# Patient Record
Sex: Male | Born: 1960 | Race: White | Hispanic: No | Smoking: Current every day smoker
Health system: Southern US, Community
[De-identification: ages and names within clinical notes are randomized; demographics above are authoritative.]

## PROBLEM LIST (undated history)

## (undated) DIAGNOSIS — R413 Other amnesia: Secondary | ICD-10-CM

## (undated) DIAGNOSIS — O2230 Deep phlebothrombosis in pregnancy, unspecified trimester: Secondary | ICD-10-CM

## (undated) DIAGNOSIS — F259 Schizoaffective disorder, unspecified: Secondary | ICD-10-CM

## (undated) DIAGNOSIS — F0280 Dementia in other diseases classified elsewhere without behavioral disturbance: Secondary | ICD-10-CM

## (undated) DIAGNOSIS — G309 Alzheimer's disease, unspecified: Secondary | ICD-10-CM

## (undated) DIAGNOSIS — K219 Gastro-esophageal reflux disease without esophagitis: Secondary | ICD-10-CM

## (undated) DIAGNOSIS — Z720 Tobacco use: Secondary | ICD-10-CM

## (undated) DIAGNOSIS — I739 Peripheral vascular disease, unspecified: Secondary | ICD-10-CM

## (undated) DIAGNOSIS — J449 Chronic obstructive pulmonary disease, unspecified: Secondary | ICD-10-CM

## (undated) DIAGNOSIS — E785 Hyperlipidemia, unspecified: Secondary | ICD-10-CM

## (undated) HISTORY — DX: Tobacco use: Z72.0

## (undated) HISTORY — PX: OTHER SURGICAL HISTORY: SHX169

---

## 2003-02-08 ENCOUNTER — Encounter: Payer: Self-pay | Admitting: *Deleted

## 2003-02-08 ENCOUNTER — Emergency Department (HOSPITAL_COMMUNITY): Admission: EM | Admit: 2003-02-08 | Discharge: 2003-02-08 | Payer: Self-pay | Admitting: *Deleted

## 2003-06-02 ENCOUNTER — Ambulatory Visit (HOSPITAL_COMMUNITY): Admission: RE | Admit: 2003-06-02 | Discharge: 2003-06-02 | Payer: Self-pay | Admitting: Family Medicine

## 2003-06-02 ENCOUNTER — Encounter: Payer: Self-pay | Admitting: Family Medicine

## 2003-12-08 ENCOUNTER — Ambulatory Visit (HOSPITAL_COMMUNITY): Admission: RE | Admit: 2003-12-08 | Discharge: 2003-12-08 | Payer: Self-pay | Admitting: Family Medicine

## 2004-08-14 ENCOUNTER — Ambulatory Visit (HOSPITAL_COMMUNITY): Admission: RE | Admit: 2004-08-14 | Discharge: 2004-08-14 | Payer: Self-pay | Admitting: Family Medicine

## 2005-12-23 ENCOUNTER — Ambulatory Visit (HOSPITAL_COMMUNITY): Admission: RE | Admit: 2005-12-23 | Discharge: 2005-12-23 | Payer: Self-pay | Admitting: Family Medicine

## 2006-09-30 ENCOUNTER — Ambulatory Visit (HOSPITAL_COMMUNITY): Admission: RE | Admit: 2006-09-30 | Discharge: 2006-09-30 | Payer: Self-pay | Admitting: Family Medicine

## 2006-11-17 HISTORY — PX: CARDIAC CATHETERIZATION: SHX172

## 2006-11-20 ENCOUNTER — Ambulatory Visit (HOSPITAL_COMMUNITY): Admission: RE | Admit: 2006-11-20 | Discharge: 2006-11-20 | Payer: Self-pay | Admitting: Cardiovascular Disease

## 2006-11-26 ENCOUNTER — Inpatient Hospital Stay (HOSPITAL_COMMUNITY): Admission: RE | Admit: 2006-11-26 | Discharge: 2006-11-27 | Payer: Self-pay | Admitting: Cardiovascular Disease

## 2007-12-07 ENCOUNTER — Emergency Department (HOSPITAL_COMMUNITY): Admission: EM | Admit: 2007-12-07 | Discharge: 2007-12-07 | Payer: Self-pay | Admitting: Emergency Medicine

## 2008-06-12 ENCOUNTER — Emergency Department (HOSPITAL_COMMUNITY): Admission: EM | Admit: 2008-06-12 | Discharge: 2008-06-12 | Payer: Self-pay | Admitting: Emergency Medicine

## 2008-11-07 IMAGING — US US EXREM LOW ARTERIAL SEG MULTIPLE BILAT
1 series · 1 of 1 positions shown · non-contrast
Comparison: none

CLINICAL DATA: Bilateral lower extremity claudication. Tobacco use. Angina.

[Series 1: unknown · 0.15mm/px · 1 of 1 slices shown]
[im 1/1]
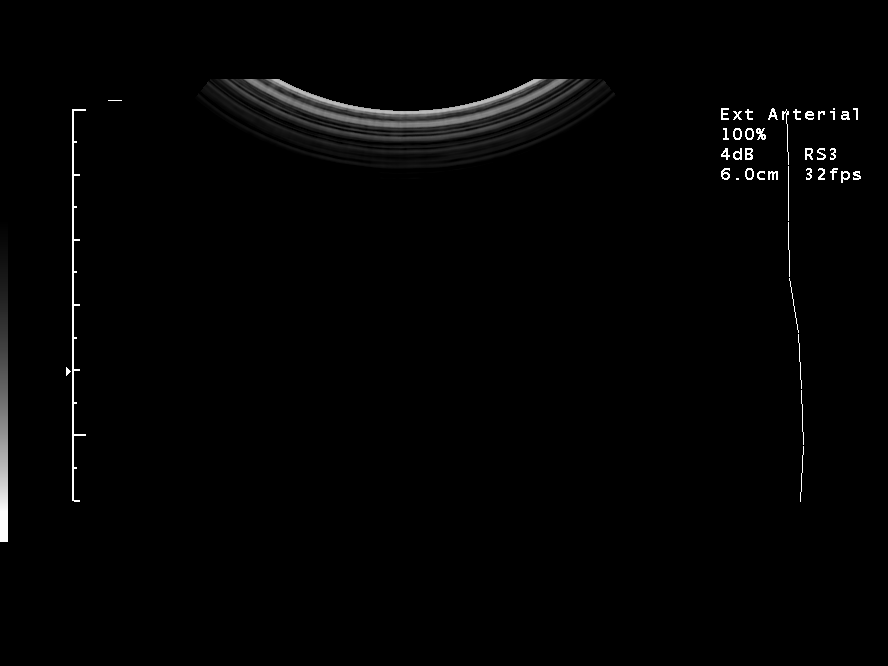

[1 of 1 positions shown; findings below may reference images not displayed]

Bilateral lower extremity arterial segmental study:

At rest, the right ABI is 0.68 , left 0.60. Biphasic waveforms are noted
throughout both lower extremities on Doppler interrogation. Segmental pressures
suggest significant bilateral aorta iliac occlusive disease. Pulse volume
recording is attenuated bilaterally. Exercise testing was not performed.
IMPRESSION: 1. Moderately severe bilateral   aortoiliac occlusive disease. Should the
patient fail conservative treatment, consider CTA (or MRA in the setting of mild
renal insufficiency) to better define the   site and nature of arterial
occlusive disease and delineate treatment options.

## 2009-06-13 ENCOUNTER — Emergency Department (HOSPITAL_COMMUNITY): Admission: EM | Admit: 2009-06-13 | Discharge: 2009-06-13 | Payer: Self-pay | Admitting: Emergency Medicine

## 2009-06-16 ENCOUNTER — Emergency Department (HOSPITAL_COMMUNITY): Admission: EM | Admit: 2009-06-16 | Discharge: 2009-06-16 | Payer: Self-pay | Admitting: Emergency Medicine

## 2009-06-16 ENCOUNTER — Ambulatory Visit: Payer: Self-pay | Admitting: Psychiatry

## 2009-06-16 ENCOUNTER — Inpatient Hospital Stay (HOSPITAL_COMMUNITY): Admission: AD | Admit: 2009-06-16 | Discharge: 2009-06-22 | Payer: Self-pay | Admitting: Psychiatry

## 2009-07-24 ENCOUNTER — Emergency Department (HOSPITAL_COMMUNITY): Admission: EM | Admit: 2009-07-24 | Discharge: 2009-07-25 | Payer: Self-pay | Admitting: Emergency Medicine

## 2009-07-24 ENCOUNTER — Ambulatory Visit: Payer: Self-pay | Admitting: Psychiatry

## 2009-07-25 ENCOUNTER — Inpatient Hospital Stay (HOSPITAL_COMMUNITY): Admission: RE | Admit: 2009-07-25 | Discharge: 2009-07-30 | Payer: Self-pay | Admitting: Psychiatry

## 2009-08-17 ENCOUNTER — Other Ambulatory Visit: Payer: Self-pay | Admitting: Emergency Medicine

## 2009-08-17 ENCOUNTER — Inpatient Hospital Stay (HOSPITAL_COMMUNITY): Admission: AD | Admit: 2009-08-17 | Discharge: 2009-08-27 | Payer: Self-pay | Admitting: Psychiatry

## 2010-07-21 IMAGING — CR DG CHEST 1V PORT
1 series · 1 of 1 positions shown · non-contrast
Comparison: 11/20/2006

CLINICAL DATA: Right-sided pleuritic chest pain.  Short of breath.

CHEST - 1 VIEW

[view not recorded]
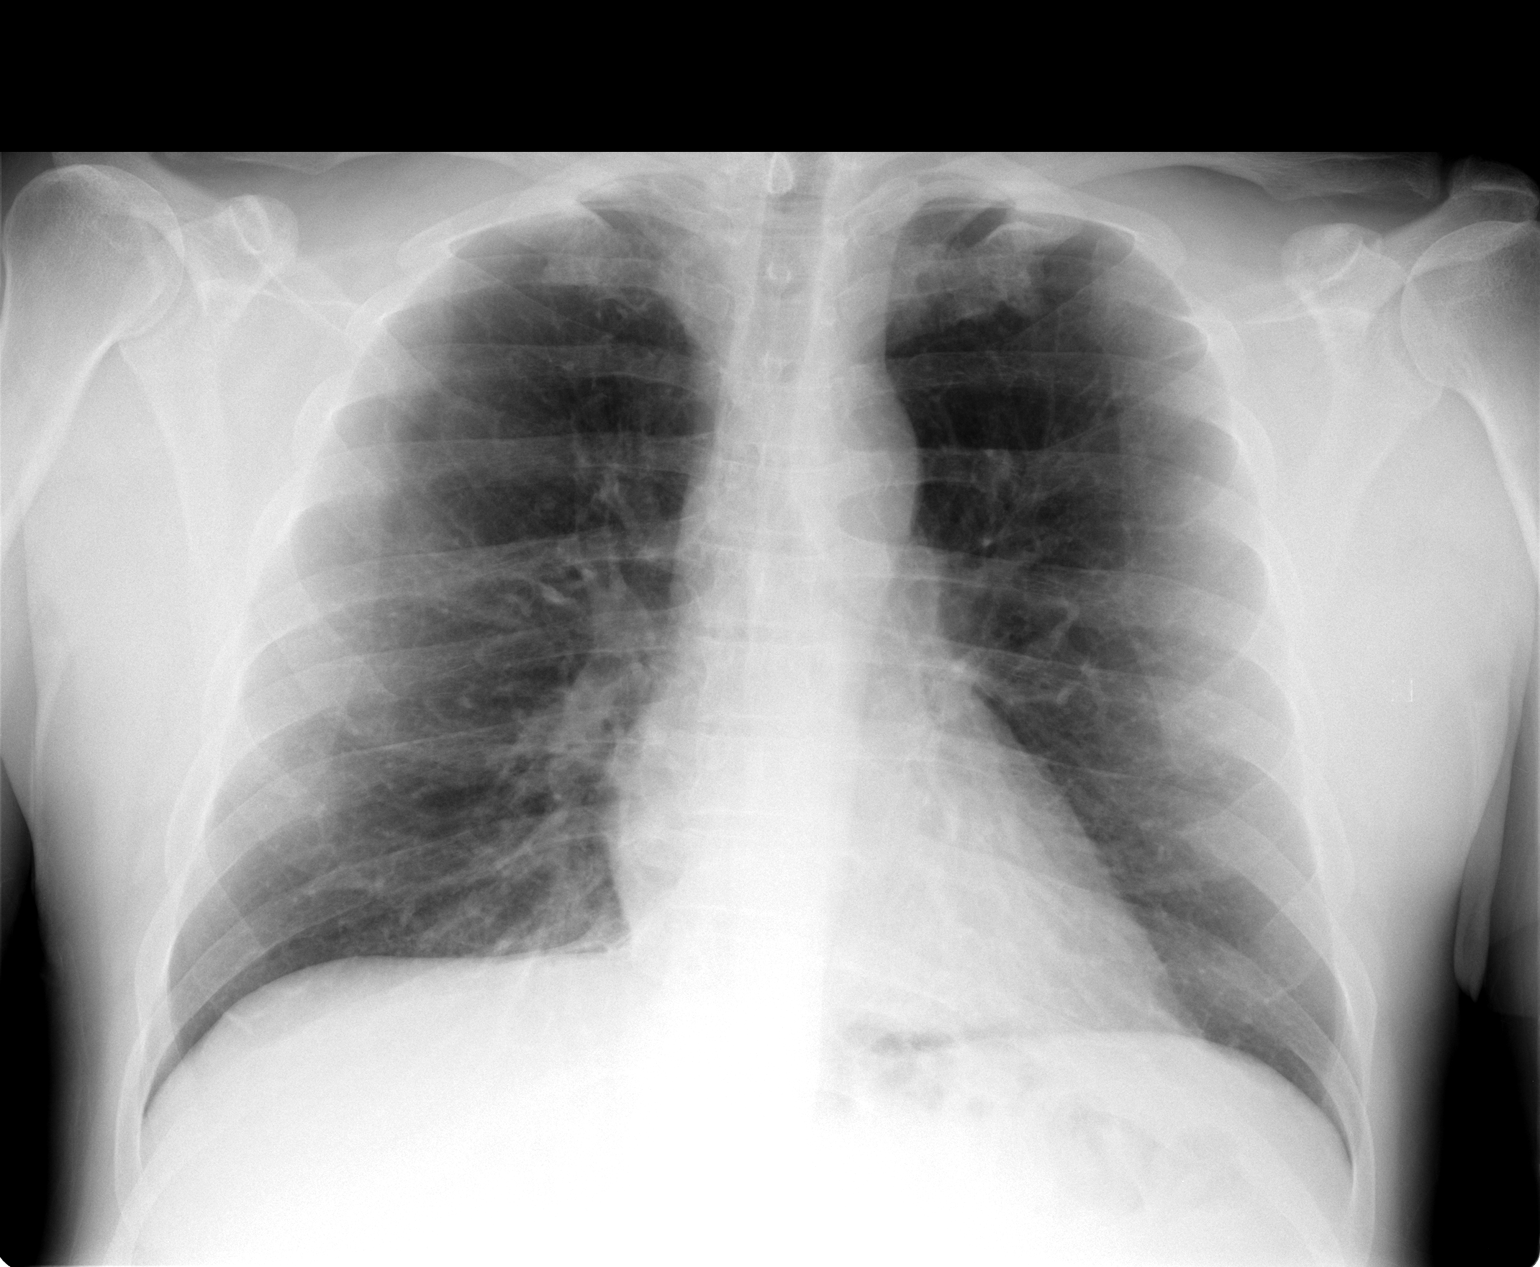

[1 of 1 positions shown; findings below may reference images not displayed]

FINDINGS: The heart size and mediastinal contours are within normal
limits.  Both lungs are clear.  There is no evidence of
pneumothorax or pleural effusion.
IMPRESSION: No active disease.

## 2011-02-20 LAB — RAPID URINE DRUG SCREEN, HOSP PERFORMED: Benzodiazepines: POSITIVE — AB

## 2011-02-20 LAB — DIFFERENTIAL
Basophils Relative: 1 % (ref 0–1)
Eosinophils Relative: 2 % (ref 0–5)
Lymphocytes Relative: 31 % (ref 12–46)
Lymphs Abs: 3.4 10*3/uL (ref 0.7–4.0)
Monocytes Absolute: 0.8 10*3/uL (ref 0.1–1.0)
Neutro Abs: 6.3 10*3/uL (ref 1.7–7.7)
Neutrophils Relative %: 58 % (ref 43–77)

## 2011-02-20 LAB — CBC
Platelets: 359 10*3/uL (ref 150–400)
RBC: 5.03 MIL/uL (ref 4.22–5.81)
RDW: 14.2 % (ref 11.5–15.5)
WBC: 10.9 10*3/uL — ABNORMAL HIGH (ref 4.0–10.5)

## 2011-02-20 LAB — BASIC METABOLIC PANEL
Potassium: 3.5 mEq/L (ref 3.5–5.1)
Sodium: 139 mEq/L (ref 135–145)

## 2011-02-21 LAB — CARDIAC PANEL(CRET KIN+CKTOT+MB+TROPI)
CK, MB: 0.6 ng/mL (ref 0.3–4.0)
Relative Index: INVALID (ref 0.0–2.5)
Total CK: 20 U/L (ref 7–232)
Troponin I: 0.01 ng/mL (ref 0.00–0.06)

## 2011-02-21 LAB — COMPREHENSIVE METABOLIC PANEL
ALT: 23 U/L (ref 0–53)
AST: 20 U/L (ref 0–37)
Albumin: 3.7 g/dL (ref 3.5–5.2)
Alkaline Phosphatase: 99 U/L (ref 39–117)
BUN: 9 mg/dL (ref 6–23)
CO2: 31 mEq/L (ref 19–32)
Calcium: 9.5 mg/dL (ref 8.4–10.5)
Chloride: 101 mEq/L (ref 96–112)
Creatinine, Ser: 0.9 mg/dL (ref 0.4–1.5)
GFR calc Af Amer: >60 mL/min (ref >60)
GFR calc non Af Amer: >60 mL/min (ref >60)
Glucose, Bld: 124 mg/dL — ABNORMAL HIGH (ref 70–99)
Potassium: 3.8 mEq/L (ref 3.5–5.1)
Sodium: 136 mEq/L (ref 135–145)
Total Bilirubin: 0.4 mg/dL (ref 0.3–1.2)
Total Protein: 7.1 g/dL (ref 6.0–8.3)

## 2011-02-21 LAB — URINALYSIS, ROUTINE W REFLEX MICROSCOPIC
Bilirubin Urine: NEGATIVE
Glucose, UA: NEGATIVE mg/dL
Hgb urine dipstick: NEGATIVE
Ketones, ur: NEGATIVE mg/dL
Nitrite: NEGATIVE
Protein, ur: NEGATIVE mg/dL
Specific Gravity, Urine: 1.01 (ref 1.005–1.030)
Urobilinogen, UA: 0.2 mg/dL (ref 0.0–1.0)
pH: 6 (ref 5.0–8.0)

## 2011-02-21 LAB — DIFFERENTIAL
Basophils Relative: 1 % (ref 0–1)
Lymphocytes Relative: 28 % (ref 12–46)
Monocytes Relative: 6 % (ref 3–12)

## 2011-02-21 LAB — RAPID URINE DRUG SCREEN, HOSP PERFORMED
Cocaine: NOT DETECTED
Tetrahydrocannabinol: NOT DETECTED

## 2011-02-21 LAB — LIPASE, BLOOD: Lipase: 26 U/L (ref 11–59)

## 2011-02-21 LAB — CBC
HCT: 47.9 % (ref 39.0–52.0)
RDW: 13.4 % (ref 11.5–15.5)
WBC: 11.1 10*3/uL — ABNORMAL HIGH (ref 4.0–10.5)

## 2011-02-23 LAB — RAPID URINE DRUG SCREEN, HOSP PERFORMED
Barbiturates: NOT DETECTED
Benzodiazepines: NOT DETECTED
Cocaine: NOT DETECTED
Opiates: POSITIVE — AB
Tetrahydrocannabinol: NOT DETECTED

## 2011-02-23 LAB — DIFFERENTIAL
Eosinophils Relative: 1 % (ref 0–5)
Lymphocytes Relative: 16 % (ref 12–46)
Monocytes Relative: 8 % (ref 3–12)

## 2011-02-23 LAB — BASIC METABOLIC PANEL
BUN: <1 mg/dL — ABNORMAL LOW (ref 6–23)
Calcium: 9.5 mg/dL (ref 8.4–10.5)
Chloride: 99 mEq/L (ref 96–112)
Sodium: 136 mEq/L (ref 135–145)

## 2011-02-23 LAB — CBC
HCT: 47.1 % (ref 39.0–52.0)
Hemoglobin: 16.3 g/dL (ref 13.0–17.0)
MCV: 95.1 fL (ref 78.0–100.0)
RBC: 4.96 MIL/uL (ref 4.22–5.81)

## 2011-02-23 LAB — URINALYSIS, ROUTINE W REFLEX MICROSCOPIC
Ketones, ur: NEGATIVE mg/dL
Specific Gravity, Urine: <1.005 — ABNORMAL LOW (ref 1.005–1.030)

## 2011-02-23 LAB — ETHANOL: Alcohol, Ethyl (B): <5 mg/dL (ref 0–10)

## 2011-04-04 NOTE — Cardiovascular Report (Signed)
[name] Vega, [name] NO.:  [id number]   MEDICAL RECORD NO.:  [id number]          PATIENT TYPE:  INP   LOCATION:  2006                         FACILITY:  [facility]   PHYSICIAN:  Nanetta Batty, M.D.   DATE OF BIRTH:  1960/11/23   DATE OF PROCEDURE:  DATE OF DISCHARGE:                            CARDIAC CATHETERIZATION   Abdominal aortogram with bifemoral runoff, selective right renal  angiogram, bilateral iliac artery PTA and stent procedure.   RESULTS:  [name] Vega is a 50 year old divorced white male, father of one  who is disabled because of schizophrenia.  He was referred by Dr.  Sudie Bailey because of claudication.  Dopplers at [facility]  showed ABIs in the 0.6 range with severe aortoiliac disease.  His risk  factors include tobacco abuse and a strong family history for heart  disease.  A Persantine-Myoview was entirely normal.  He presents now for  angiography and potential intervention for relief of symptoms of  functional limiting claudication.   DESCRIPTION OF PROCEDURE:  The patient was brought to the second floor  [facility] angiographic suite in the postabsorptive state.  He was  premedicated with p.o. Valium.  Both groins were prepped and shaved in  the usual sterile fashion.  Xylocaine 1% was used for local anesthesia.  A 6-French sheath was inserted in the right femoral artery using  standard Seldinger technique.  A 6-French pigtail catheter was used for  abdominal aortography bifemoral runoff.  Visipaque dye was used for the  entirety of the case.  Aortic systolic pressures recorded.  Selective  angiography was also performed using a short 5-French right Judkins  catheter of the right renal artery.   ANGIOGRAPHIC RESULTS:  1. Abdominal aorta.      a.     Renal arteries-50% ostial right renal artery stenosis with a       20 mm pullback gradient.      b.     Moderately atherosclerotic infrarenal abdominal aorta.  2. Left lower  extremity.      a.     A 90% ostial left common iliac artery stenosis with normal-       appearing SFA and three-vessel runoff.  3. Right lower extremity:      a.     A 90% ostial right common iliac artery stenosis of normal-       appearing SFA and three-vessel runoff.   DESCRIPTION OF PROCEDURE:  Access was obtained in the left femoral  artery using standard Seldinger technique under direct fluoroscopic  control.  The patient received 3000 units of heparin intravenously.  A 7-  French 30 cm long Cordis bright-tip sheath was inserted over Wholey  wires in both femoral arteries and placed just distal to the iliac  lesions.  Wholey wires were placed across both lesions.  The patient had  predilatation of both iliacs using 5.2 balloons and kissing balloon  technique.  Following this, stenting was performed with 7 x 24 Genesis  on balloon stent premount doing kissing stent deployment at 8-10  atmospheres, resulting reduction 90% bilateral ostial iliac artery  stenosis to 0% residual with excellent flow and no dissection.  The  patient tolerated the procedure well.   IMPRESSION:  Successful bilateral iliac artery percutaneous transluminal  angioplasty and stenting of distal aortoiliac disease, resulting in  excellent angiographic result.  The patient should achieve clinical  improvement.  ACT was measured and both sheaths were removed.  Pressure  was held in each groin to achieve hemostasis.  The patient  left lab in stable condition.  He will be hydrated overnight, discharged  home in the morning.  I will get followup Dopplers and ABIs in our  office at which time I will see him back in followup.  He left lab in  stable condition.  Dr. Mila Homer. Sudie Bailey was notified of these  results.      Nanetta Batty, M.D.  Electronically Signed     JB/MEDQ  D:  11/26/2006  T:  11/27/2006  Job:  [id number]   cc:   2nd Floor [facility]  [facility]  Mila Homer. Sudie Bailey, M.D.

## 2011-04-04 NOTE — Discharge Summary (Signed)
NAMEJERAMI, [name] Vega NO.:  [id number]   MEDICAL RECORD NO.:  [id number]          PATIENT TYPE:  IPS   LOCATION:  0400                          [facility]ACILITY[facility]  [facility]   PHYSICIAN:  Anselm Jungling, MD  DATE OF BIRTH:  [birth year]   DATE OF ADMISSION:  06/16/2009  DATE OF DISCHARGE:  06/22/2009                               DISCHARGE SUMMARY   IDENTIFYING DATA/REASON FOR ADMISSION:  This was an inpatient  psychiatric admission for [name] Vega, a 50 year old male who was admitted  to the inpatient psychiatry service due to relapse of his psychotic  disorder.  Please refer to the admission note for further details  pertaining to the symptoms, circumstances and history that led to his  hospitalization.  He was given an initial Axis I diagnosis of  schizophrenia, chronic undifferentiated type.   MEDICAL AND LABORATORY:  The patient was medically and physically  assessed by the psychiatric nurse practitioner.  He was continued on  Protonix for GERD, Norvasc for hypertension, albuterol and Flovent for  asthma, and Plavix 75 mg daily.  There were no significant medical  issues.   HOSPITAL COURSE:  The patient was admitted to the adult inpatient  psychiatric service.  He presented as a cooperative male with soft  speech, positive for paranoia, suicidal ideation, but no plan or  attempt.  He admitted to auditory hallucinations.  He verbalized a  strong desire for help.   He was treated with a psychotropic regimen of Prolixin, Cogentin, and  Klonopin.  He participated in therapeutic group and activities.  He was  a reasonably good participant.  He initially evaluated by Kathi Ludwig T.  Lolly Mustache, M.D.  The undersigned assumed his care on the third hospital  day.  On that date, I met the patient, reviewed the chart and discussed  the progress with the staff.  The patient was polite, appropriate, but  flat of affect.  His insight was fair.  He continued to complain of  auditory  hallucinations.  He complained of extreme irritability and  fears that he was going to be going off.  He continued to report  suicidal thoughts without any plan or intent.  Because of this, Prolixin  dose was increased and Klonopin was added.   Over the next 3 days, the patient made excellent progress, with  reduction in irritability, improving cognition and thought organization.   By the seventh hospital day, the patient appeared to be fully  recompensated.  He agreed to the following aftercare plan.   AFTERCARE:  The patient was to follow-up at [facility] in  [place] with an appointment on August 11, at 8 a.m.   DISCHARGE MEDICATIONS:  1. Prolixin 15 mg nightly.  2. Cogentin 1 mg daily.  3. Klonopin 1 mg t.i.d.  4. Plavix 75 mg daily.  5. Protonix 80 mg daily.  6. Norvasc 10 mg daily.  7. Albuterol two puffs q.i.d. p.r.n. asthma symptoms.  8. Flovent 2 puffs daily.   DISCHARGE DIAGNOSES:  AXIS I:  Schizophrenia, chronic paranoid type,  acute exacerbation, resolving.  AXIS II:  Deferred.  AXIS III:  1.  Gastroesophageal reflux disease.  2.  Hypertension.  3.  Asthma.  AXIS IV:  Stressors severe.  AXIS V:  GAF on discharge 55.      Anselm Jungling, MD  Electronically Signed     SPB/MEDQ  D:  07/02/2009  T:  07/02/2009  Job:  [id number]

## 2011-04-04 NOTE — Discharge Summary (Signed)
NAMETAY, WHITWELL NO.:  [id number]   MEDICAL RECORD NO.:  [id number]          PATIENT TYPE:  INP   LOCATION:  2006                         FACILITY:  [facility]   PHYSICIAN:  Nanetta Batty, M.D.   DATE OF BIRTH:  [birth year]   DATE OF ADMISSION:  11/26/2006  DATE OF DISCHARGE:  11/27/2006                               DISCHARGE SUMMARY   DISCHARGE DIAGNOSES:  1. Claudication, bilateral common iliac artery PTA and stenting this      admission.  2. History of schizophrenia.  3. Smoking, chronic obstructive pulmonary disease.  4. Dyslipidemia.   HOSPITAL COURSE:  The patient is a 50 year old male followed by Dr.  Sudie Bailey.  He is disabled because of schizophrenia.  He is divorced and  has a child and a grandchild.  He has a long history of smoking and  family history of coronary disease.  He saw Dr. Allyson Sabal in the office  October 20, 2006 with symptoms of claudication.  He had abnormal  Dopplers as an outpatient.  Further cardiac clearance was done,  including a Cardiolite study which showed no ischemia with good LV  function.  Echocardiogram showed good LV function with LVH and impaired  LV relaxation.  The patient was set up for an outpatient angiogram.  He  is [facility] to [facility] November 26, 2006 for angiogram and underwent  bilateral common iliac artery PTA and stenting for 90% stenoses.  He had  good results.  We feel he can be discharged November 27, 2006.  He will  need follow-up lower extremity arterial Dopplers.  We did add Plavix and  simvastatin.   LABS:  White count 10.9, hemoglobin of 14.1, hematocrit 40.7, platelets  305, sodium 140, potassium 4, BUN 6, creatinine 0.9.  Urine culture was  done on the 10th.  The results of this is pending.  Chest x-Ray showed  mild bronchitic changes.  Lipids as an outpatient shows a LDL of 103,  HDL 37, total cholesterol 045.   DISPOSITION:  The patient is discharged in stable condition.  He will  follow up with Dr.  Allyson Sabal as an outpatient, will need to follow-up his  urine culture.  He has been instructed on not smoking.   DISCHARGE MEDICATIONS:  1. Coated aspirin once a day.  2. Plavix 75 mg a day.  3. Simvastatin 40 mg a day.  4. Chantix 2 mg a day.  5. Cogentin 1 mg a day.  6. Albuterol and Flovent inhalers 2 puffs q.i.d.  7. Nexium 40 mg a day.      Abelino Derrick, P.A.      Nanetta Batty, M.D.  Electronically Signed    LKK/MEDQ  D:  11/27/2006  T:  11/27/2006  Job:  [id number]   cc:   Mila Homer. Sudie Bailey, M.D.

## 2011-08-07 LAB — URINALYSIS, ROUTINE W REFLEX MICROSCOPIC
Bilirubin Urine: NEGATIVE
Glucose, UA: NEGATIVE
Hgb urine dipstick: NEGATIVE
Ketones, ur: NEGATIVE
Nitrite: NEGATIVE
Protein, ur: NEGATIVE

## 2011-08-15 LAB — POCT CARDIAC MARKERS
CKMB, poc: <1 — ABNORMAL LOW
Troponin i, poc: <0.05

## 2011-08-15 LAB — CBC
HCT: 47.3
Hemoglobin: 16.1
MCHC: 34
Platelets: 329
RDW: 13.2
WBC: 10

## 2011-08-15 LAB — BASIC METABOLIC PANEL
BUN: 5 — ABNORMAL LOW
GFR calc Af Amer: >60
Potassium: 3.8
Sodium: 140

## 2011-08-15 LAB — DIFFERENTIAL
Basophils Absolute: 0.2 — ABNORMAL HIGH
Basophils Relative: 2 — ABNORMAL HIGH
Lymphocytes Relative: 33
Monocytes Absolute: 0.4
Monocytes Relative: 4
Neutrophils Relative %: 58

## 2011-10-31 ENCOUNTER — Encounter (HOSPITAL_COMMUNITY): Payer: Self-pay | Admitting: Pharmacy Technician

## 2011-11-03 ENCOUNTER — Other Ambulatory Visit (HOSPITAL_COMMUNITY): Payer: Self-pay | Admitting: Cardiovascular Disease

## 2011-11-03 ENCOUNTER — Ambulatory Visit (HOSPITAL_COMMUNITY)
Admission: RE | Admit: 2011-11-03 | Discharge: 2011-11-03 | Disposition: A | Discharge status: Home / Self Care | Payer: PRIVATE HEALTH INSURANCE | Source: Ambulatory Visit | Attending: Cardiovascular Disease | Admitting: Cardiovascular Disease

## 2011-11-03 DIAGNOSIS — Z01818 Encounter for other preprocedural examination: Secondary | ICD-10-CM | POA: Insufficient documentation

## 2011-11-03 DIAGNOSIS — Z01811 Encounter for preprocedural respiratory examination: Secondary | ICD-10-CM

## 2011-11-06 ENCOUNTER — Other Ambulatory Visit: Payer: Self-pay | Admitting: Cardiovascular Disease

## 2011-11-06 MED ORDER — SODIUM CHLORIDE 0.9 % IJ SOLN: 3.0000 mL | INTRAMUSCULAR | Status: DC | PRN

## 2011-11-06 MED ORDER — SODIUM CHLORIDE 0.9 % IV SOLN: 250.0000 mL | INTRAVENOUS | Status: DC | PRN

## 2011-11-06 MED ORDER — SODIUM CHLORIDE 0.9 % IJ SOLN: 3.0000 mL | Freq: Two times a day (BID) | INTRAMUSCULAR | Status: DC

## 2011-11-07 HISTORY — PX: CARDIAC CATHETERIZATION: SHX172

## 2011-11-21 ENCOUNTER — Encounter (HOSPITAL_COMMUNITY)
Admission: RE | Disposition: A | Discharge status: Home / Self Care | Payer: Self-pay | Source: Ambulatory Visit | Attending: Cardiovascular Disease

## 2011-11-21 ENCOUNTER — Ambulatory Visit (HOSPITAL_COMMUNITY)
Admission: RE | Admit: 2011-11-21 | Discharge: 2011-11-22 | Disposition: A | Discharge status: Home / Self Care | Payer: PRIVATE HEALTH INSURANCE | Source: Ambulatory Visit | Attending: Cardiovascular Disease | Admitting: Cardiovascular Disease

## 2011-11-21 DIAGNOSIS — I739 Peripheral vascular disease, unspecified: Secondary | ICD-10-CM | POA: Diagnosis present

## 2011-11-21 DIAGNOSIS — Y831 Surgical operation with implant of artificial internal device as the cause of abnormal reaction of the patient, or of later complication, without mention of misadventure at the time of the procedure: Secondary | ICD-10-CM | POA: Insufficient documentation

## 2011-11-21 DIAGNOSIS — J4489 Other specified chronic obstructive pulmonary disease: Secondary | ICD-10-CM | POA: Insufficient documentation

## 2011-11-21 DIAGNOSIS — E785 Hyperlipidemia, unspecified: Secondary | ICD-10-CM | POA: Diagnosis present

## 2011-11-21 DIAGNOSIS — T82898A Other specified complication of vascular prosthetic devices, implants and grafts, initial encounter: Secondary | ICD-10-CM | POA: Insufficient documentation

## 2011-11-21 DIAGNOSIS — Y92009 Unspecified place in unspecified non-institutional (private) residence as the place of occurrence of the external cause: Secondary | ICD-10-CM | POA: Insufficient documentation

## 2011-11-21 DIAGNOSIS — I745 Embolism and thrombosis of iliac artery: Secondary | ICD-10-CM | POA: Insufficient documentation

## 2011-11-21 DIAGNOSIS — Z87891 Personal history of nicotine dependence: Secondary | ICD-10-CM | POA: Insufficient documentation

## 2011-11-21 DIAGNOSIS — F259 Schizoaffective disorder, unspecified: Secondary | ICD-10-CM | POA: Diagnosis present

## 2011-11-21 DIAGNOSIS — J449 Chronic obstructive pulmonary disease, unspecified: Secondary | ICD-10-CM | POA: Diagnosis present

## 2011-11-21 HISTORY — PX: ATHERECTOMY: SHX5502

## 2011-11-21 LAB — BASIC METABOLIC PANEL
BUN: 9 mg/dL (ref 6–23)
CO2: 26 mEq/L (ref 19–32)
Chloride: 101 mEq/L (ref 96–112)
Glucose, Bld: 110 mg/dL — ABNORMAL HIGH (ref 70–99)
Potassium: 4.3 mEq/L (ref 3.5–5.1)
Sodium: 138 mEq/L (ref 135–145)

## 2011-11-21 LAB — CBC
HCT: 43.7 % (ref 39.0–52.0)
Hemoglobin: 15.2 g/dL (ref 13.0–17.0)
MCH: 30.5 pg (ref 26.0–34.0)
MCHC: 34.8 g/dL (ref 30.0–36.0)
RBC: 4.99 MIL/uL (ref 4.22–5.81)

## 2011-11-21 LAB — PROTIME-INR: Prothrombin Time: 14 seconds (ref 11.6–15.2)

## 2011-11-21 SURGERY — ATHERECTOMY
Anesthesia: LOCAL

## 2011-11-21 MED ORDER — MEMANTINE HCL 5 MG PO TABS
5.0000 mg | ORAL_TABLET | Freq: Two times a day (BID) | ORAL | Status: DC
  Administered 2011-11-21 – 2011-11-22 (×2): 5 mg via ORAL
  Filled 2011-11-21 (×3): qty 1

## 2011-11-21 MED ORDER — TRAMADOL HCL 50 MG PO TABS
50.0000 mg | ORAL_TABLET | Freq: Four times a day (QID) | ORAL | Status: DC | PRN
  Filled 2011-11-21 (×2): qty 1

## 2011-11-21 MED ORDER — ASPIRIN EC 81 MG PO TBEC
81.0000 mg | DELAYED_RELEASE_TABLET | Freq: Every day | ORAL | Status: DC
  Administered 2011-11-21 – 2011-11-22 (×2): 81 mg via ORAL
  Filled 2011-11-21 (×2): qty 1

## 2011-11-21 MED ORDER — HEPARIN (PORCINE) IN NACL 2-0.9 UNIT/ML-% IJ SOLN
INTRAMUSCULAR | Status: AC
  Filled 2011-11-21: qty 1000

## 2011-11-21 MED ORDER — BIVALIRUDIN 250 MG IV SOLR
INTRAVENOUS | Status: AC
  Filled 2011-11-21: qty 250

## 2011-11-21 MED ORDER — ONDANSETRON HCL 4 MG/2ML IJ SOLN: 4.0000 mg | Freq: Four times a day (QID) | INTRAMUSCULAR | Status: DC | PRN

## 2011-11-21 MED ORDER — SIMVASTATIN 20 MG PO TABS
20.0000 mg | ORAL_TABLET | Freq: Every day | ORAL | Status: DC
  Administered 2011-11-21: 20 mg via ORAL
  Filled 2011-11-21 (×2): qty 1

## 2011-11-21 MED ORDER — CLONAZEPAM 0.5 MG PO TABS: 0.2500 mg | ORAL_TABLET | Freq: Three times a day (TID) | ORAL | Status: DC

## 2011-11-21 MED ORDER — LIDOCAINE HCL (PF) 1 % IJ SOLN
INTRAMUSCULAR | Status: AC
  Filled 2011-11-21: qty 30

## 2011-11-21 MED ORDER — NITROGLYCERIN 0.2 MG/ML ON CALL CATH LAB
INTRAVENOUS | Status: AC
  Filled 2011-11-21: qty 1

## 2011-11-21 MED ORDER — BENZTROPINE MESYLATE 1 MG PO TABS
1.0000 mg | ORAL_TABLET | Freq: Two times a day (BID) | ORAL | Status: DC
  Administered 2011-11-21 – 2011-11-22 (×2): 1 mg via ORAL
  Filled 2011-11-21 (×3): qty 1

## 2011-11-21 MED ORDER — SODIUM CHLORIDE 0.9 % IV SOLN
INTRAVENOUS | Status: DC
  Administered 2011-11-21: 09:00:00 via INTRAVENOUS

## 2011-11-21 MED ORDER — FENTANYL CITRATE 0.05 MG/ML IJ SOLN
INTRAMUSCULAR | Status: AC
  Filled 2011-11-21: qty 2

## 2011-11-21 MED ORDER — THERA M PLUS PO TABS
1.0000 | ORAL_TABLET | Freq: Every day | ORAL | Status: DC
  Administered 2011-11-21 – 2011-11-22 (×2): 1 via ORAL
  Filled 2011-11-21 (×2): qty 1

## 2011-11-21 MED ORDER — FLUOXETINE HCL 20 MG PO CAPS
20.0000 mg | ORAL_CAPSULE | Freq: Every day | ORAL | Status: DC
  Administered 2011-11-22: 20 mg via ORAL
  Filled 2011-11-21 (×2): qty 1

## 2011-11-21 MED ORDER — HEPARIN SODIUM (PORCINE) 1000 UNIT/ML IJ SOLN
INTRAMUSCULAR | Status: AC
  Filled 2011-11-21: qty 1

## 2011-11-21 MED ORDER — TIOTROPIUM BROMIDE MONOHYDRATE 18 MCG IN CAPS
18.0000 ug | ORAL_CAPSULE | Freq: Every day | RESPIRATORY_TRACT | Status: DC
  Administered 2011-11-21: 18 ug via RESPIRATORY_TRACT
  Filled 2011-11-21: qty 5

## 2011-11-21 MED ORDER — CLOPIDOGREL BISULFATE 75 MG PO TABS
75.0000 mg | ORAL_TABLET | Freq: Every day | ORAL | Status: DC
  Administered 2011-11-22: 75 mg via ORAL

## 2011-11-21 MED ORDER — ACETAMINOPHEN 325 MG PO TABS: 650.0000 mg | ORAL_TABLET | ORAL | Status: DC | PRN

## 2011-11-21 MED ORDER — DIAZEPAM 5 MG PO TABS
ORAL_TABLET | ORAL | Status: AC
  Administered 2011-11-21: 5 mg via ORAL
  Filled 2011-11-21: qty 1

## 2011-11-21 MED ORDER — DIAZEPAM 5 MG PO TABS
5.0000 mg | ORAL_TABLET | ORAL | Status: AC
  Administered 2011-11-21: 5 mg via ORAL

## 2011-11-21 MED ORDER — HYDROXYZINE HCL 50 MG PO TABS
50.0000 mg | ORAL_TABLET | Freq: Two times a day (BID) | ORAL | Status: DC | PRN
  Filled 2011-11-21: qty 1

## 2011-11-21 MED ORDER — MIDAZOLAM HCL 2 MG/2ML IJ SOLN
INTRAMUSCULAR | Status: AC
  Filled 2011-11-21: qty 2

## 2011-11-21 MED ORDER — CLONAZEPAM 0.5 MG PO TABS
0.5000 mg | ORAL_TABLET | Freq: Every day | ORAL | Status: DC
  Administered 2011-11-21: 0.5 mg via ORAL
  Filled 2011-11-21 (×2): qty 1

## 2011-11-21 MED ORDER — RISPERIDONE 3 MG PO TABS
3.0000 mg | ORAL_TABLET | Freq: Every day | ORAL | Status: DC
  Administered 2011-11-21: 3 mg via ORAL
  Filled 2011-11-21 (×2): qty 1

## 2011-11-21 MED ORDER — BECLOMETHASONE DIPROPIONATE 80 MCG/ACT IN AERS
1.0000 | INHALATION_SPRAY | Freq: Two times a day (BID) | RESPIRATORY_TRACT | Status: DC
  Filled 2011-11-21 (×2): qty 8.7

## 2011-11-21 MED ORDER — SODIUM CHLORIDE 0.9 % IV SOLN
INTRAVENOUS | Status: AC
  Administered 2011-11-21: 14:00:00 via INTRAVENOUS

## 2011-11-21 MED ORDER — ZOLPIDEM TARTRATE 5 MG PO TABS: 5.0000 mg | ORAL_TABLET | Freq: Every evening | ORAL | Status: DC | PRN

## 2011-11-21 MED ORDER — CLONAZEPAM 0.5 MG PO TABS
0.2500 mg | ORAL_TABLET | ORAL | Status: DC
  Administered 2011-11-22: 0.25 mg via ORAL

## 2011-11-21 MED ORDER — HYDROXYZINE HCL 50 MG PO TABS: 50.0000 mg | ORAL_TABLET | Freq: Three times a day (TID) | ORAL | Status: DC | PRN

## 2011-11-21 MED ORDER — HYDROXYZINE HCL 50 MG PO TABS
100.0000 mg | ORAL_TABLET | Freq: Every evening | ORAL | Status: DC | PRN
  Filled 2011-11-21: qty 2

## 2011-11-21 MED ORDER — ASPIRIN 81 MG PO CHEW
CHEWABLE_TABLET | ORAL | Status: AC
  Filled 2011-11-21: qty 4

## 2011-11-21 NOTE — Progress Notes (Signed)
Informed cath lab that patient ready except PT and BMP pending, instructed to bring pt to holding per Thereasa Distance.

## 2011-11-21 NOTE — H&P (Signed)
  H & P will be scanned in.  Pt was reexamined and existing H & P reviewed. No changes found.  Runell Gess, MD [facility] 11/21/2011 10:28 AM

## 2011-11-21 NOTE — Op Note (Signed)
MARQUAL MI is a 51 y.o. male    [id number] LOCATION:  FACILITY: [facility]  PHYSICIAN: Nanetta Batty, M.D. 1961/09/24   DATE OF PROCEDURE:  11/21/2011  DATE OF DISCHARGE:  [facility]  PV Intervention    History obtained from chart review.the patient is a 51 year old thin-appearing divorced Caucasian male father one child who lives with his parents and is involved with an act team he has schizoaffective disorder with multiple of multiple smooth psychotropic drugs and sees a psychiatrist. He has a history of peripheral vascular occlusive disease status post bilateral iliac artery PTA and stenting by myself 11/26/2006. His other problems include hyperlipidemia and recently discontinued tobacco abuse.  He had a Myoview stress test performed in our office 11/12/2006 which was low risk. Dopplers perform arks 16 2012 revealed an ABI of 0.93 on the right and one on the left with high-frequency signals in both iliac arteries. When I saw him back in June I intended to get followup Dopplers which were not obtained. Recent Dopplers however performed 09/26/2011 revealed that the right iliac stent has subsequently occluded and his left iliac stent had gotten progressively worse. He has disabling claudication. He presents now for angiography and potential percutaneous revascularization I cast covered stents.   PROCEDURE DESCRIPTION:    The patient was brought to the second floor  [facility] in the postabsorptive state. He was  premedicated wit hby mouth Valium, IV Versed and fentanyl.Marland Kitchen His right and left groins was prepped and shaved in usual sterile fashion. Xylocaine 1% was used  for local anesthesia. A 5 French sheath was inserted into the left common femoral  artery using standard Seldinger technique. The patient received  7500 units  of heparin  intravenously.      HEMODYNAMICS:    AO SYSTOLIC/AO DIASTOLIC: 155/62  Angiographic results:  1:  abdominal aorta-renal artery is widely patent. It revealed him aorta was free of significant disease.  2: Left lower extremity-80% in-stent restenosis within the proximal left common iliac artery stent. There was three-vessel runoff  3: Right lower extremity-occluded proximal right common iliac artery stent with three-vessel runoff  Procedure description: Access was obtained in the right common femoral artery using a 7 French Bright tip sheath, standard Seldinger technique. A 5 French sheath in the right common femoral artery was exchanged over a wire for a 7 French Bright tip sheath. The patient received a total of 7500 units of heparin intravenously and had 180 cc of contrast administered during the case. A 5 French pigtail catheter was used for abdominal aortography with bifemoral runoff using digital subtraction bolus chase that they will technique.  The ACT was documented greater than 200. Using a 035 quick cross catheter along with a 035 angled stiff Glidewire the occluded right common iliac artery stent was crossed. The systemic stains for 35 Woolley wire. The patient received 4 chewable aspirin. If he passes this was performed with a 4 x 2 balloon upgrading to a 5 x 4 balloon. Angiography revealed a patent vessel with a significant amount of intraluminal organized thrombus. Following this the patient received Angiomax bolus. A 7 x 59 mm I. Cast covered stent was then deployed and carefully positioned. This resulted in coverage of the angiographically visualized thrombus and pressor preservation of the hypogastric artery with complete revascularization. Following this a 7 x 30 9I cast covered stent was then deployed in the left common iliac artery within the previously placed stent. "kissing balloon" enteroclysis performed  at the origin of both iliac arteries using 7 x 2 balloon at 10 atmospheres. The patient angiography was performed with a 5 French pigtail catheter. The final intragastric result  with reduction of a totally occluded right common iliac artery to 0% stenosis and an 80% in-stent restenosis within the left colic artery stent to 0% stenosis. The patient tolerated the procedure well. Otherwise removed and the sheaths were secured. His left lateral stable condition. He did receive 300 mg of oral Plavix.     IMPRESSION:successful bilateral iliac artery PTA and stent using I. Cast covered stents for in-stent restenosis and claudication.the patient was gently hydrated overnight, discharge him in the morning on aspirin and Plavix will get followup Dopplers and ABIs in my office after discharge. He left the lab in stable condition.  Runell Gess MD, [facility] 11/21/2011 11:56 AM

## 2011-11-22 DIAGNOSIS — I739 Peripheral vascular disease, unspecified: Secondary | ICD-10-CM | POA: Diagnosis present

## 2011-11-22 DIAGNOSIS — E785 Hyperlipidemia, unspecified: Secondary | ICD-10-CM | POA: Diagnosis present

## 2011-11-22 DIAGNOSIS — J449 Chronic obstructive pulmonary disease, unspecified: Secondary | ICD-10-CM | POA: Diagnosis present

## 2011-11-22 DIAGNOSIS — F259 Schizoaffective disorder, unspecified: Secondary | ICD-10-CM | POA: Diagnosis present

## 2011-11-22 MED ORDER — CLOPIDOGREL BISULFATE 75 MG PO TABS: 75.0000 mg | ORAL_TABLET | Freq: Every day | ORAL | Status: AC

## 2011-11-22 MED ORDER — ACETAMINOPHEN 325 MG PO TABS: 650.0000 mg | ORAL_TABLET | ORAL | Status: AC | PRN

## 2011-11-22 NOTE — Discharge Summary (Signed)
Patient ID: [name] Vega,  MRN: [id number], DOB/AGE: 1961-10-15 51 y.o.  Admit date: 11/21/2011 Discharge date: 11/22/2011  Primary Care Provider: Dr Ernestine Conrad Primary Cardiologist: Dr Allyson Sabal  Discharge Diagnoses Active Problems:  Claudication  PVD (peripheral vascular disease) s/p bilat iliac PTA this adm for ISR from 2007  COPD (chronic obstructive pulmonary disease)  Schizo affective schizophrenia  Dyslipidemia    Procedures: Bilat iliac PTA 11/21/11   Hospital Course: .The patient is a 51 year old thin-appearing divorced Caucasian male father one child who lives with his parents and is involved with an act team he has schizoaffective disorder with multiple of multiple psychotropic drugs and sees a psychiatrist. He has a history of peripheral vascular occlusive disease status post bilateral iliac artery PTA and stenting 11/26/2006. His other problems include hyperlipidemia and recently discontinued tobacco abuse.  He had a Myoview stress test performed in our office 11/12/2006 which was low risk. Dopplers had revealed an ABI of 0.93 on the right and one on the left with high-frequency signals in both iliac arteries. When I saw him back in June I intended to get followup Dopplers which were not obtained. Recent Dopplers however performed 09/26/2011 revealed that the right iliac stent has subsequently occluded and his left iliac stent had gotten progressively worse. He has disabling claudication. He presents now for angiography and potential percutaneous revascularization I cast covered stents. The patient had successful bilateral iliac artery PTA and stent using I. Cast covered stents for in-stent restenosis and claudication.the patient was gently hydrated overnight, discharge him in the morning on aspirin and Plavix will get followup Dopplers and ABIs in my office after discharge.   Discharge Vitals:  Blood pressure 147/80, pulse 90, temperature 98.9 F (37.2 C), temperature source Oral,  resp. rate 16, height 5\' 9"  (1.753 m), weight 76.3 kg (168 lb 3.4 oz), SpO2 97.00%.    Labs: Results for orders placed during the hospital encounter of 11/21/11 (from the past 48 hour(s))  BASIC METABOLIC PANEL     Status: Abnormal   Collection Time   11/21/11  8:53 AM      Component Value Range Comment   Sodium 138  135 - 145 (mEq/L)    Potassium 4.3  3.5 - 5.1 (mEq/L)    Chloride 101  96 - 112 (mEq/L)    CO2 26  19 - 32 (mEq/L)    Glucose, Bld 110 (*) 70 - 99 (mg/dL)    BUN 9  6 - 23 (mg/dL)    Creatinine, Ser 4.09  0.50 - 1.35 (mg/dL)    Calcium 9.6  8.4 - 10.5 (mg/dL)    GFR calc non Af Amer 78 (*) >90 (mL/min)    GFR calc Af Amer >90  >90 (mL/min)   CBC     Status: Normal   Collection Time   11/21/11  8:53 AM      Component Value Range Comment   WBC 8.4  4.0 - 10.5 (K/uL)    RBC 4.99  4.22 - 5.81 (MIL/uL)    Hemoglobin 15.2  13.0 - 17.0 (g/dL)    HCT 81.1  91.4 - 78.2 (%)    MCV 87.6  78.0 - 100.0 (fL)    MCH 30.5  26.0 - 34.0 (pg)    MCHC 34.8  30.0 - 36.0 (g/dL)    RDW 95.6  21.3 - 08.6 (%)    Platelets 245  150 - 400 (K/uL)   PROTIME-INR     Status: Normal   Collection Time  11/21/11  8:53 AM      Component Value Range Comment   Prothrombin Time 14.0  11.6 - 15.2 (seconds)    INR 1.06  0.00 - 1.49      Disposition:  Follow-up Information    Follow up with Runell Gess, MD. (office will call)    Contact information:   [address] [place] [address] [phone]          Discharge Medications:  Current Discharge Medication List    START taking these medications   Details  acetaminophen (TYLENOL) 325 MG tablet Take 2 tablets (650 mg total) by mouth every 4 (four) hours as needed. Qty: 30 tablet    clopidogrel (PLAVIX) 75 MG tablet Take 1 tablet (75 mg total) by mouth daily with breakfast. Qty: 30 tablet, Refills: 5      CONTINUE these medications which have NOT CHANGED   Details  albuterol (PROVENTIL) (2.5 MG/3ML)  0.083% nebulizer solution Take 2.5 mg by nebulization every 6 (six) hours as needed. For shortness of breath.     beclomethasone (QVAR) 80 MCG/ACT inhaler Inhale 1 puff into the lungs 2 (two) times daily.      benztropine (COGENTIN) 1 MG tablet Take 1 mg by mouth 2 (two) times daily.      clonazePAM (KLONOPIN) 0.5 MG tablet Take 0.25-0.5 mg by mouth 3 (three) times daily. 0.25 mg (1/2 tab) in the morning and noon and 0.50 mg (1 tab) in at bedtime     FLUoxetine (PROZAC) 20 MG capsule Take 20 mg by mouth daily.      hydrOXYzine (ATARAX/VISTARIL) 50 MG tablet Take 50-100 mg by mouth 3 (three) times daily as needed. 50 mg (1 tab) in am and at noon and 100 mg (2 tab) at bedtime     memantine (NAMENDA) 5 MG tablet Take 5 mg by mouth 2 (two) times daily.      Multiple Vitamins-Minerals (MULTIVITAMINS THER. W/MINERALS) TABS Take 1 tablet by mouth daily.      omeprazole (PRILOSEC) 40 MG capsule Take 40 mg by mouth daily.      risperiDONE (RISPERDAL) 1 MG tablet Take 3 mg by mouth at bedtime.      simvastatin (ZOCOR) 20 MG tablet Take 20 mg by mouth at bedtime.      tiotropium (SPIRIVA) 18 MCG inhalation capsule Place 18 mcg into inhaler and inhale daily.      aspirin EC 81 MG tablet Take 81 mg by mouth daily.          Outstanding Labs/Studies  Duration of Discharge Encounter: Greater than 30 minutes including physician time.  Jolene Provost PA-C 11/22/2011 8:30 AM  Agree. No groin complications. Good bilateral pedal pulses.  Thurmon Fair, MD, [facility] [phone] 11/22/2011, 9:18 AM

## 2011-11-22 NOTE — Progress Notes (Signed)
Pt. Discharged to home accompanied by mental health caseworker in private vehicle.  Escorted to exit by nurse tech.  All discharge instructions and prescriptions reviewed with patient and caseworker as per patient.

## 2011-11-22 NOTE — Progress Notes (Signed)
Subjective:  No complaints  Objective:  Vital Signs in the last 24 hours: Temp:  [98.1 F (36.7 C)-98.9 F (37.2 C)] 98.9 F (37.2 C) (01/05 0805) Pulse Rate:  [73-90] 90  (01/05 0805) Resp:  [11-18] 16  (01/05 0805) BP: (117-147)/(62-86) 147/80 mmHg (01/05 0805) SpO2:  [96 %-99 %] 97 % (01/05 0805) Weight:  [75.751 kg (167 lb)-76.3 kg (168 lb 3.4 oz)] 168 lb 3.4 oz (76.3 kg) (01/05 0600)  Intake/Output from previous day:  Intake/Output Summary (Last 24 hours) at 11/22/11 0819 Last data filed at 11/21/11 2214  Gross per 24 hour  Intake    120 ml  Output    675 ml  Net   -555 ml    Physical Exam: General appearance: alert, cooperative and no distress Lungs: clear to auscultation bilaterally Heart: regular rate and rhythm some echymosis Rt groin, no hematoma, no echymosis Lt groin   Rate: 100  Rhythm: sinus tachycardia  Lab Results:  Basename 11/21/11 0853  WBC 8.4  HGB 15.2  PLT 245    Basename 11/21/11 0853  NA 138  K 4.3  CL 101  CO2 26  GLUCOSE 110*  BUN 9  CREATININE 1.08   No results found for this basename: TROPONINI:2,CK,MB:2 in the last 72 hours Hepatic Function Panel No results found for this basename: PROT,ALBUMIN,AST,ALT,ALKPHOS,BILITOT,BILIDIR,IBILI in the last 72 hours No results found for this basename: CHOL in the last 72 hours  Basename 11/21/11 0853  INR 1.06    Imaging: No results found.  Cardiac Studies:  Assessment/Plan:   Active Problems:  Claudication  PVD (peripheral vascular disease) s/p bilat iliac PTA this adm for ISR from 2007  COPD (chronic obstructive pulmonary disease)  Schizo affective schizophrenia  Dyslipidemia   Plan- d/c f/u with Dr Mariana Arn PA-C 11/22/2011, 8:19 AM  I have seen and examined the patient along with Corine Shelter, PA.  I have reviewed the chart, notes and new data.  I agree with PA's note.  Key new complaints: None Key examination changes: No significant hematoma/ecchymosis at  access site. Minimal bruise on right. Good pedal pulses.  PLAN: DC home.  Thurmon Fair, MD, [facility] [phone] 11/22/2011, 9:19 AM

## 2011-11-24 MED FILL — Dextrose Inj 5%: INTRAVENOUS | Qty: 50 | Status: AC

## 2011-11-25 LAB — POCT ACTIVATED CLOTTING TIME
Activated Clotting Time: 210 seconds
Activated Clotting Time: 518 seconds

## 2012-07-29 ENCOUNTER — Emergency Department (HOSPITAL_COMMUNITY)
Admission: EM | Admit: 2012-07-29 | Discharge: 2012-07-29 | Disposition: A | Discharge status: Home / Self Care | Payer: PRIVATE HEALTH INSURANCE | Attending: Emergency Medicine | Admitting: Emergency Medicine

## 2012-07-29 ENCOUNTER — Encounter (HOSPITAL_COMMUNITY): Payer: Self-pay | Admitting: Emergency Medicine

## 2012-07-29 DIAGNOSIS — Z79899 Other long term (current) drug therapy: Secondary | ICD-10-CM | POA: Insufficient documentation

## 2012-07-29 DIAGNOSIS — R443 Hallucinations, unspecified: Secondary | ICD-10-CM

## 2012-07-29 DIAGNOSIS — G309 Alzheimer's disease, unspecified: Secondary | ICD-10-CM | POA: Insufficient documentation

## 2012-07-29 DIAGNOSIS — Z86718 Personal history of other venous thrombosis and embolism: Secondary | ICD-10-CM | POA: Insufficient documentation

## 2012-07-29 DIAGNOSIS — Z880 Allergy status to penicillin: Secondary | ICD-10-CM | POA: Insufficient documentation

## 2012-07-29 DIAGNOSIS — I739 Peripheral vascular disease, unspecified: Secondary | ICD-10-CM | POA: Insufficient documentation

## 2012-07-29 DIAGNOSIS — J4489 Other specified chronic obstructive pulmonary disease: Secondary | ICD-10-CM | POA: Insufficient documentation

## 2012-07-29 DIAGNOSIS — F259 Schizoaffective disorder, unspecified: Secondary | ICD-10-CM | POA: Insufficient documentation

## 2012-07-29 DIAGNOSIS — J449 Chronic obstructive pulmonary disease, unspecified: Secondary | ICD-10-CM | POA: Insufficient documentation

## 2012-07-29 DIAGNOSIS — K219 Gastro-esophageal reflux disease without esophagitis: Secondary | ICD-10-CM | POA: Insufficient documentation

## 2012-07-29 DIAGNOSIS — F0280 Dementia in other diseases classified elsewhere without behavioral disturbance: Secondary | ICD-10-CM | POA: Insufficient documentation

## 2012-07-29 DIAGNOSIS — E785 Hyperlipidemia, unspecified: Secondary | ICD-10-CM | POA: Insufficient documentation

## 2012-07-29 DIAGNOSIS — Z87891 Personal history of nicotine dependence: Secondary | ICD-10-CM | POA: Insufficient documentation

## 2012-07-29 HISTORY — DX: Other amnesia: R41.3

## 2012-07-29 HISTORY — DX: Dementia in other diseases classified elsewhere, unspecified severity, without behavioral disturbance, psychotic disturbance, mood disturbance, and anxiety: F02.80

## 2012-07-29 HISTORY — DX: Alzheimer's disease, unspecified: G30.9

## 2012-07-29 HISTORY — DX: Schizoaffective disorder, unspecified: F25.9

## 2012-07-29 HISTORY — DX: Gastro-esophageal reflux disease without esophagitis: K21.9

## 2012-07-29 HISTORY — DX: Hyperlipidemia, unspecified: E78.5

## 2012-07-29 HISTORY — DX: Peripheral vascular disease, unspecified: I73.9

## 2012-07-29 HISTORY — DX: Chronic obstructive pulmonary disease, unspecified: J44.9

## 2012-07-29 HISTORY — DX: Deep phlebothrombosis in pregnancy, unspecified trimester: O22.30

## 2012-07-29 LAB — CBC WITH DIFFERENTIAL/PLATELET
Eosinophils Absolute: 0.1 10*3/uL (ref 0.0–0.7)
Eosinophils Relative: 1 % (ref 0–5)
HCT: 45.9 % (ref 39.0–52.0)
Hemoglobin: 15.8 g/dL (ref 13.0–17.0)
Lymphocytes Relative: 22 % (ref 12–46)
Lymphs Abs: 2.3 10*3/uL (ref 0.7–4.0)
MCH: 30.1 pg (ref 26.0–34.0)
MCV: 87.4 fL (ref 78.0–100.0)
Monocytes Relative: 8 % (ref 3–12)
Platelets: 285 10*3/uL (ref 150–400)
RBC: 5.25 MIL/uL (ref 4.22–5.81)
WBC: 10.5 10*3/uL (ref 4.0–10.5)

## 2012-07-29 LAB — BASIC METABOLIC PANEL
BUN: 10 mg/dL (ref 6–23)
CO2: 23 mEq/L (ref 19–32)
Calcium: 9.5 mg/dL (ref 8.4–10.5)
Glucose, Bld: 105 mg/dL — ABNORMAL HIGH (ref 70–99)
Sodium: 133 mEq/L — ABNORMAL LOW (ref 135–145)

## 2012-07-29 LAB — RAPID URINE DRUG SCREEN, HOSP PERFORMED
Cocaine: NOT DETECTED
Opiates: NOT DETECTED
Tetrahydrocannabinol: NOT DETECTED

## 2012-07-29 LAB — URINALYSIS, ROUTINE W REFLEX MICROSCOPIC
Bilirubin Urine: NEGATIVE
Glucose, UA: 100 mg/dL — AB
Specific Gravity, Urine: >1.03 — ABNORMAL HIGH (ref 1.005–1.030)
Urobilinogen, UA: 0.2 mg/dL (ref 0.0–1.0)
pH: 5.5 (ref 5.0–8.0)

## 2012-07-29 LAB — URINE MICROSCOPIC-ADD ON

## 2012-07-29 NOTE — ED Notes (Signed)
Pt c/o auditory hallucinations x2 weeks. Pt states he is unable to comprehend what the voices are saying. Pt denies SI/HI and states he otherwise feels healthy. Pt has hx of schiz and has been taking meds ar rx.

## 2012-07-29 NOTE — ED Notes (Signed)
Pt brought in by [facility] representative. Pt called the crisis line this am with auditory hallucinations, paranoid about neighbors wanting to hurt him, and pt denies any si/hi.

## 2012-07-29 NOTE — ED Provider Notes (Signed)
History   This chart was scribed for Flint Melter, MD by Sofie Rower. The patient was seen in room APA15/APA15 and the patient's care was started at 1:03PM    CSN: [id number]  Arrival date & time 07/29/12  1134   First MD Initiated Contact with Patient 07/29/12 1303      Chief Complaint  Patient presents with  . V70.1    (Consider location/radiation/quality/duration/timing/severity/associated sxs/prior treatment) The history is provided by the patient. No language interpreter was used.    [name] Vega is a 51 y.o. male , with a hx of schizoaffective disorder, who presents to the Emergency Department complaining of sudden, progressively worsening, auditory hallucinations, onset two weeks ago. The pt reports he has been hearing voices, whom have been informing him that they want to cause him harm. The pt denies SI/HI.   The pt does not smoke or drink alcohol.   PCP is Dr. Loney Hering. Psychiatrist is Dr. Rosalia Hammers.    Past Medical History  Diagnosis Date  . COPD (chronic obstructive pulmonary disease)   . GERD (gastroesophageal reflux disease)   . Schizoaffective disorder   . Alzheimer's dementia   . Memory loss   . PVD (peripheral vascular disease)   . Hyperlipidemia   . DVT (deep vein thrombosis) in pregnancy     Past Surgical History  Procedure Date  . Leg stents     History reviewed. No pertinent family history.  History  Substance Use Topics  . Smoking status: Former Games developer  . Smokeless tobacco: Not on file  . Alcohol Use: No      Review of Systems  All other systems reviewed and are negative.    Allergies  Ibuprofen and Penicillins  Home Medications   Current Outpatient Rx  Name Route Sig Dispense Refill  . ALBUTEROL SULFATE (2.5 MG/3ML) 0.083% IN NEBU Nebulization Take 2.5 mg by nebulization every 6 (six) hours as needed. For shortness of breath.     . ASPIRIN EC 81 MG PO TBEC Oral Take 81 mg by mouth daily.      . BECLOMETHASONE DIPROPIONATE 80  MCG/ACT IN AERS Inhalation Inhale 1 puff into the lungs 2 (two) times daily.      Marland Kitchen BENZTROPINE MESYLATE 1 MG PO TABS Oral Take 1 mg by mouth 2 (two) times daily.      Marland Kitchen CLONAZEPAM 0.5 MG PO TABS Oral Take 0.25-0.5 mg by mouth 3 (three) times daily. 0.25 mg (1/2 tab) in the morning and noon and 0.50 mg (1 tab) in at bedtime     . CLOPIDOGREL BISULFATE 75 MG PO TABS Oral Take 1 tablet (75 mg total) by mouth daily with breakfast. 30 tablet 5  . FLUOXETINE HCL 20 MG PO CAPS Oral Take 20 mg by mouth daily.      Marland Kitchen HYDROXYZINE HCL 50 MG PO TABS Oral Take 50-100 mg by mouth 3 (three) times daily as needed. 50 mg (1 tab) in am and at noon and 100 mg (2 tab) at bedtime     . MEMANTINE HCL 5 MG PO TABS Oral Take 5 mg by mouth 2 (two) times daily.      Carma Leaven M PLUS PO TABS Oral Take 1 tablet by mouth daily.      Marland Kitchen OMEPRAZOLE 40 MG PO CPDR Oral Take 40 mg by mouth daily.      Marland Kitchen RISPERIDONE 1 MG PO TABS Oral Take 3 mg by mouth at bedtime.      Marland Kitchen SIMVASTATIN  20 MG PO TABS Oral Take 20 mg by mouth at bedtime.      Marland Kitchen TIOTROPIUM BROMIDE MONOHYDRATE 18 MCG IN CAPS Inhalation Place 18 mcg into inhaler and inhale daily.        BP 151/90  Pulse 75  Temp 99.2 F (37.3 C) (Oral)  Resp 18  Ht 5\' 9"  (1.753 m)  SpO2 98%  Physical Exam  Nursing note and vitals reviewed. Constitutional: He is oriented to person, place, and time. He appears well-developed and well-nourished.  HENT:  Head: Normocephalic and atraumatic.  Right Ear: External ear normal.  Left Ear: External ear normal.       Oropharynx dry.   Eyes: Conjunctivae normal and EOM are normal. Pupils are equal, round, and reactive to light.  Neck: Normal range of motion and phonation normal. Neck supple.  Cardiovascular: Normal rate, regular rhythm, normal heart sounds and intact distal pulses.   Pulmonary/Chest: Effort normal and breath sounds normal. He exhibits no bony tenderness.  Abdominal: Soft. Normal appearance. There is no tenderness.    Musculoskeletal: Normal range of motion. He exhibits tenderness.       Tenderness to light touch detected at right flank.   Neurological: He is alert and oriented to person, place, and time. He has normal strength. No cranial nerve deficit or sensory deficit. He exhibits normal muscle tone. Coordination normal.  Skin: Skin is warm, dry and intact.    ED Course  Procedures (including critical care time)  DIAGNOSTIC STUDIES: Oxygen Saturation is 98% on room air, normal by my interpretation.    COORDINATION OF CARE:  I discussed the case with the patient's crisis worker, who brought him here. She reports increasing episodes of paranoia, hallucinations, and wandering over the last 2 weeks. She feels that he is decompensated. The patient has his stepmother to call the crisis worker this morning. \  We contacted the patient's ACT team again. They decided to come and get the patient taken to a psychiatry appointment, at 3 PM today.  Results for orders placed during the hospital encounter of 07/29/12  URINE RAPID DRUG SCREEN (HOSP PERFORMED)      Component Value Range   Opiates NONE DETECTED  NONE DETECTED   Cocaine NONE DETECTED  NONE DETECTED   Benzodiazepines NONE DETECTED  NONE DETECTED   Amphetamines NONE DETECTED  NONE DETECTED   Tetrahydrocannabinol NONE DETECTED  NONE DETECTED   Barbiturates NONE DETECTED  NONE DETECTED  URINALYSIS, ROUTINE W REFLEX MICROSCOPIC      Component Value Range   Color, Urine YELLOW  YELLOW   APPearance CLEAR  CLEAR   Specific Gravity, Urine >1.030 (*) 1.005 - 1.030   pH 5.5  5.0 - 8.0   Glucose, UA 100 (*) NEGATIVE mg/dL   Hgb urine dipstick SMALL (*) NEGATIVE   Bilirubin Urine NEGATIVE  NEGATIVE   Ketones, ur NEGATIVE  NEGATIVE mg/dL   Protein, ur 161 (*) NEGATIVE mg/dL   Urobilinogen, UA 0.2  0.0 - 1.0 mg/dL   Nitrite NEGATIVE  NEGATIVE   Leukocytes, UA NEGATIVE  NEGATIVE  CBC WITH DIFFERENTIAL      Component Value Range   WBC 10.5  4.0 -  10.5 K/uL   RBC 5.25  4.22 - 5.81 MIL/uL   Hemoglobin 15.8  13.0 - 17.0 g/dL   HCT 09.6  04.5 - 40.9 %   MCV 87.4  78.0 - 100.0 fL   MCH 30.1  26.0 - 34.0 pg   MCHC 34.4  30.0 -  36.0 g/dL   RDW 40.9  81.1 - 91.4 %   Platelets 285  150 - 400 K/uL   Neutrophils Relative 69  43 - 77 %   Neutro Abs 7.2  1.7 - 7.7 K/uL   Lymphocytes Relative 22  12 - 46 %   Lymphs Abs 2.3  0.7 - 4.0 K/uL   Monocytes Relative 8  3 - 12 %   Monocytes Absolute 0.8  0.1 - 1.0 K/uL   Eosinophils Relative 1  0 - 5 %   Eosinophils Absolute 0.1  0.0 - 0.7 K/uL   Basophils Relative 1  0 - 1 %   Basophils Absolute 0.1  0.0 - 0.1 K/uL  BASIC METABOLIC PANEL      Component Value Range   Sodium 133 (*) 135 - 145 mEq/L   Potassium 3.9  3.5 - 5.1 mEq/L   Chloride 101  96 - 112 mEq/L   CO2 23  19 - 32 mEq/L   Glucose, Bld 105 (*) 70 - 99 mg/dL   BUN 10  6 - 23 mg/dL   Creatinine, Ser 7.82  0.50 - 1.35 mg/dL   Calcium 9.5  8.4 - 95.6 mg/dL   GFR calc non Af Amer 77 (*) >90 mL/min   GFR calc Af Amer 89 (*) >90 mL/min  ETHANOL      Component Value Range   Alcohol, Ethyl (B) <11  0 - 11 mg/dL  URINE MICROSCOPIC-ADD ON      Component Value Range   WBC, UA 0-2  <3 WBC/hpf   RBC / HPF 0-2  <3 RBC/hpf       1. Hallucinations       MDM  Chronic psychiatric disease, with hallucinations. Today patient not homicidal, suicidal or in danger. He stable for discharge to the appropriate followup with his psychiatrist today, in the office.     I personally performed the services described in this documentation, which was scribed in my presence. The recorded information has been reviewed and considered.     Plan: Home Medications- usual; Home Treatments- regular activities; Recommended follow up- Psychiatry appt. At 3 PM today   Flint Melter, MD 07/29/12 1433

## 2012-07-29 NOTE — BH Assessment (Signed)
Assessment Note   [name] Vega is an 51 y.o. male. The patient says he has had an increase in auditory hallucinations over the last 2-3 weeks. He does not understand what they are saying. He denies any SI or HI. He has not history of suicide attempts nor a history of violence. It was his step-mother who called ACT this morning after he told her he heard someone talking outside his window. He denies any substance abuse. He is followed by Dr. Hassie Bruce at [facility]. He also works with the ACT TEAM. He is keeping his appointments and is taking his medications as prescribed. He thinks he needs his medications increased or changed, and he will be fine. He does not want inpatient care but would go if it was felt necessary.  Axis I: Chronic Paranoid Schizophrenia Axis II: Deferred Axis III:  Past Medical History  Diagnosis Date  . COPD (chronic obstructive pulmonary disease)   . GERD (gastroesophageal reflux disease)   . Schizoaffective disorder   . Alzheimer's dementia   . Memory loss   . PVD (peripheral vascular disease)   . Hyperlipidemia   . DVT (deep vein thrombosis) in pregnancy    Axis IV: other psychosocial or environmental problems and problems with primary support group Axis V: 41-50 serious symptoms  Past Medical History:  Past Medical History  Diagnosis Date  . COPD (chronic obstructive pulmonary disease)   . GERD (gastroesophageal reflux disease)   . Schizoaffective disorder   . Alzheimer's dementia   . Memory loss   . PVD (peripheral vascular disease)   . Hyperlipidemia   . DVT (deep vein thrombosis) in pregnancy     Past Surgical History  Procedure Date  . Leg stents     Family History: History reviewed. No pertinent family history.  Social History:  reports that he has quit smoking. He does not have any smokeless tobacco history on file. He reports that he does not drink alcohol or use illicit drugs.  Additional Social History:     CIWA: CIWA-Ar BP: 151/90  mmHg Pulse Rate: 75  COWS:    Allergies:  Allergies  Allergen Reactions  . Ibuprofen Other (See Comments)    unknown  . Penicillins Other (See Comments)    unknown    Home Medications:  (Not in a hospital admission)  OB/GYN Status:  No LMP for male patient.  General Assessment Data Location of Assessment: [facility] ACT Assessment: Yes Living Arrangements: Parent Can pt return to current living arrangement?: Yes Admission Status: Voluntary Is patient capable of signing voluntary admission?: Yes Transfer from: Acute Hospital Referral Source: MD  Education Status Is patient currently in school?: No  Risk to self Suicidal Ideation: No Suicidal Intent: No Is patient at risk for suicide?: No Suicidal Plan?: No Access to Means: No What has been your use of drugs/alcohol within the last 12 months?: none Previous Attempts/Gestures: No How many times?: 0  Other Self Harm Risks: denies Triggers for Past Attempts: None known Intentional Self Injurious Behavior: None Family Suicide History: No Recent stressful life event(s): Conflict (Comment) (feels step-mother tries to take too much care of himi) Persecutory voices/beliefs?: No Depression: No Substance abuse history and/or treatment for substance abuse?: No Suicide prevention information given to non-admitted patients: Not applicable  Risk to Others Homicidal Ideation: No Thoughts of Harm to Others: No Current Homicidal Intent: No Current Homicidal Plan: No Access to Homicidal Means: No History of harm to others?: No Assessment of Violence: None Noted  Does patient have access to weapons?: No Criminal Charges Pending?: No Does patient have a court date: No  Psychosis Hallucinations: Auditory Delusions: None noted  Mental Status Report Appear/Hygiene: Disheveled Eye Contact: Good Motor Activity: Freedom of movement Speech: Logical/coherent Level of Consciousness: Alert Mood: Depressed Affect: Appropriate to  circumstance Anxiety Level: None Thought Processes: Coherent;Relevant Judgement: Unimpaired Orientation: Person;Place;Time Obsessive Compulsive Thoughts/Behaviors: None  Cognitive Functioning Concentration: Normal Memory: Recent Intact;Remote Intact IQ: Average Insight: Fair Impulse Control: Fair Appetite: Good Weight Loss: 0  Weight Gain: 0  Sleep: No Change Vegetative Symptoms: None  ADLScreening [facility] Assessment Services) Patient's cognitive ability adequate to safely complete daily activities?: Yes Patient able to express need for assistance with ADLs?: Yes Independently performs ADLs?: Yes (appropriate for developmental age)  Abuse/Neglect [facility]) Physical Abuse: Denies Verbal Abuse: Denies Sexual Abuse: Denies  Prior Inpatient Therapy Prior Inpatient Therapy: Yes Prior Therapy Dates: 2010 Prior Therapy Facilty/Provider(s): [facility] Reason for Treatment: psychosis  Prior Outpatient Therapy Prior Outpatient Therapy: Yes Prior Therapy Dates: current Prior Therapy Facilty/Provider(s): [facility] Reason for Treatment: medications,casemanagement  ADL Screening (condition at time of admission) Patient's cognitive ability adequate to safely complete daily activities?: Yes Patient able to express need for assistance with ADLs?: Yes Independently performs ADLs?: Yes (appropriate for developmental age)       Abuse/Neglect Assessment (Assessment to be complete while patient is alone) Physical Abuse: Denies Verbal Abuse: Denies Sexual Abuse: Denies Values / Beliefs Cultural Requests During Hospitalization: None Spiritual Requests During Hospitalization: None        Additional Information 1:1 In Past 12 Months?: No CIRT Risk: No Elopement Risk: No Does patient have medical clearance?: Yes     Disposition: Discussed with Dr. Effie Shy. Patient does not meet criteria for inpatient care at this time.  Patient to be discharged to the ACT TEAM, to arrange follow up  with DR Hassie Bruce at [facility].Left message with Robinette Haines  Supervisor of the ACT TEAM, requesting their help with returning the patient to his home.  Disposition Disposition of Patient: Other dispositions (Tele-psych;medication recommendationsw) Other disposition(s): Other (Comment) (tele-psych)  On Site Evaluation by:   Reviewed with Physician:     Jearld Pies 07/29/2012 12:59 PM

## 2013-06-27 ENCOUNTER — Telehealth: Payer: Self-pay | Admitting: Cardiovascular Disease

## 2013-06-27 NOTE — Telephone Encounter (Signed)
[name] was calling in regards to [name] Vega medication refill for Plavix. She stated that she was following up on the sent in Rx

## 2013-06-28 MED ORDER — CLOPIDOGREL BISULFATE 75 MG PO TABS: 75.0000 mg | ORAL_TABLET | Freq: Every day | ORAL | Status: DC

## 2013-06-28 MED ORDER — PANTOPRAZOLE SODIUM 40 MG PO TBEC: 40.0000 mg | DELAYED_RELEASE_TABLET | Freq: Every day | ORAL | Status: AC

## 2013-06-28 NOTE — Telephone Encounter (Signed)
Returned call and spoke w/ [name] in pharmacy.  Asked if pt gets #30 or #90.  Stated pt gets #30.  Refill(s) sent to pharmacy.  ?Interaction w/ omeprazole and clopidogrel.  Advised changed to Pantoprazole.    Wolfgang Phoenix, PharmD notified and advised change.  Rx changed.  Call to pt and spoke w/ Marylu Lund, pt's stepmother.  Informed of change and verbalized understanding.  Rx sent to pharmacy.

## 2013-07-08 ENCOUNTER — Other Ambulatory Visit (HOSPITAL_COMMUNITY): Payer: Self-pay | Admitting: Cardiovascular Disease

## 2013-07-08 ENCOUNTER — Encounter (HOSPITAL_COMMUNITY): Payer: Self-pay | Admitting: Cardiovascular Disease

## 2013-07-08 DIAGNOSIS — I739 Peripheral vascular disease, unspecified: Secondary | ICD-10-CM

## 2013-08-02 ENCOUNTER — Ambulatory Visit (HOSPITAL_COMMUNITY)
Admission: RE | Admit: 2013-08-02 | Discharge: 2013-08-02 | Disposition: A | Discharge status: Home / Self Care | Payer: PRIVATE HEALTH INSURANCE | Source: Ambulatory Visit | Attending: Cardiovascular Disease | Admitting: Cardiovascular Disease

## 2013-08-02 DIAGNOSIS — I739 Peripheral vascular disease, unspecified: Secondary | ICD-10-CM

## 2013-08-02 NOTE — Progress Notes (Signed)
Lower Extremity Arterial Duplex Completed. °Brianna L Mazza,RVT °

## 2013-08-25 ENCOUNTER — Encounter: Payer: Self-pay | Admitting: *Deleted

## 2014-01-24 ENCOUNTER — Telehealth (HOSPITAL_COMMUNITY): Payer: Self-pay | Admitting: *Deleted

## 2014-01-31 ENCOUNTER — Telehealth (HOSPITAL_COMMUNITY): Payer: Self-pay | Admitting: *Deleted

## 2014-03-24 ENCOUNTER — Other Ambulatory Visit: Payer: Self-pay | Admitting: *Deleted

## 2014-03-24 MED ORDER — CLOPIDOGREL BISULFATE 75 MG PO TABS
75.0000 mg | ORAL_TABLET | Freq: Every day | ORAL | Status: DC
Start: 2014-03-24 — End: 2014-05-23

## 2014-03-24 NOTE — Telephone Encounter (Signed)
Rx refill sent to patient pharmacy   

## 2014-05-23 ENCOUNTER — Other Ambulatory Visit: Payer: Self-pay

## 2014-05-23 MED ORDER — CLOPIDOGREL BISULFATE 75 MG PO TABS: 75.0000 mg | ORAL_TABLET | Freq: Every day | ORAL | Status: DC

## 2014-05-23 NOTE — Telephone Encounter (Signed)
Rx was sent to pharmacy electronically. Patient's last office visit - 03/24/2013 Dr Erlene QuanJ Berry.

## 2014-07-26 ENCOUNTER — Ambulatory Visit: Payer: PRIVATE HEALTH INSURANCE | Admitting: Cardiovascular Disease

## 2014-08-07 ENCOUNTER — Other Ambulatory Visit: Payer: Self-pay | Admitting: *Deleted

## 2014-08-07 MED ORDER — CLOPIDOGREL BISULFATE 75 MG PO TABS: 75.0000 mg | ORAL_TABLET | Freq: Every day | ORAL | Status: DC

## 2014-08-15 ENCOUNTER — Telehealth: Payer: Self-pay | Admitting: Cardiovascular Disease

## 2014-08-15 MED ORDER — CLOPIDOGREL BISULFATE 75 MG PO TABS: 75.0000 mg | ORAL_TABLET | Freq: Every day | ORAL | Status: DC

## 2014-08-15 NOTE — Telephone Encounter (Signed)
Need refill on his Plavix 75 mg #30.

## 2014-09-12 ENCOUNTER — Other Ambulatory Visit: Payer: Self-pay | Admitting: Cardiovascular Disease

## 2014-09-27 ENCOUNTER — Ambulatory Visit: Payer: PRIVATE HEALTH INSURANCE | Admitting: Cardiovascular Disease

## 2014-10-26 ENCOUNTER — Encounter (HOSPITAL_COMMUNITY): Payer: Self-pay | Admitting: Cardiovascular Disease

## 2014-11-07 ENCOUNTER — Other Ambulatory Visit: Payer: Self-pay | Admitting: Cardiovascular Disease

## 2014-11-07 NOTE — Telephone Encounter (Signed)
Rx refill sent to patient pharmacy   

## 2014-11-21 ENCOUNTER — Ambulatory Visit (INDEPENDENT_AMBULATORY_CARE_PROVIDER_SITE_OTHER): Payer: Medicare HMO | Admitting: Cardiovascular Disease

## 2014-11-21 ENCOUNTER — Encounter: Payer: Self-pay | Admitting: Cardiovascular Disease

## 2014-11-21 VITALS — BP 134/78 | HR 86 | Ht 68.0 in | Wt 177.2 lb

## 2014-11-21 DIAGNOSIS — Z720 Tobacco use: Secondary | ICD-10-CM | POA: Diagnosis not present

## 2014-11-21 DIAGNOSIS — E785 Hyperlipidemia, unspecified: Secondary | ICD-10-CM | POA: Diagnosis not present

## 2014-11-21 DIAGNOSIS — Z79899 Other long term (current) drug therapy: Secondary | ICD-10-CM | POA: Diagnosis not present

## 2014-11-21 DIAGNOSIS — I739 Peripheral vascular disease, unspecified: Secondary | ICD-10-CM

## 2014-11-21 NOTE — Assessment & Plan Note (Signed)
Status post bilateral iliac stenting by myself 11/26/06 with documentation of "in-stent restenosis and repeat intervention 11/21/11 with bilateral ICast covered stents.he currently denies claudication. His most recent lower extremity arterial Doppler studies performed 08/02/13 revealed normal ABIs with a high-frequency signal in his left common iliac artery. We will repeat lower extremity arterial Doppler studies

## 2014-11-21 NOTE — Patient Instructions (Signed)
Dr Allyson SabalBerry has ordered the following test(s) to be done: 1. Blood work - to be done FASTING 2. Lower Extremity Arterial dopplers - This test is an ultrasound of the arteries in the legs. It looks at arterial blood flow in the legs. Allow one hour for Lower Arterial scans. There are no restrictions or special instructions.  Dr Allyson SabalBerry wants you to follow-up in 1 year. You will receive a reminder letter in the mail one months in advance. If you don't receive a letter, please call our office to schedule the follow-up appointment.

## 2014-11-21 NOTE — Progress Notes (Signed)
11/21/2014 [name] CheadleCharles F Vega   10-Feb-1961  [id number]  Primary Physician Ernestine ConradBLUTH, KIRK, MD Primary Cardiologist: Runell GessJonathan J. Maryum Batterson MD Roseanne RenoFACP,FACC,FAHA, FSCAI   HPI:  the patient is a 54 year old thin-appearing divorced Caucasian male father one child who lives with his parents and is involved with an act team he has schizoaffective disorder on multiple  psychotropic drugs and sees a psychiatrist. He has a history of peripheral vascular occlusive disease status post bilateral iliac artery PTA and stenting by myself 11/26/2006. His other problems include hyperlipidemia and continued  tobacco abuse.I last saw him in the office after his procedure in 2013.  He had a Myoview stress test performed in our office 11/12/2006 which was low risk. Dopplers perform arks 16 2012 revealed an ABI of 0.93 on the right and one on the left with high-frequency signals in both iliac arteries. When I saw him back in June I intended to get followup Dopplers which were not obtained. Recent Dopplers however performed 09/26/2011 revealed that the right iliac stent has subsequently occluded and his left iliac stent had gotten progressively worse. He had disabling claudication. I re-angiogramed him 11/21/11 revealing an occluded right common iliac stent with high grade instant restenosis within the left common iliac stent. Both were percutaneously recanalized with ICast  covered stents resulting in marked clinical angiographic and Doppler improvement. He currently denies claudication, chest pain or shortness of breath.   Current Outpatient Prescriptions  Medication Sig Dispense Refill  . albuterol (PROVENTIL) (2.5 MG/3ML) 0.083% nebulizer solution Take 2.5 mg by nebulization every 6 (six) hours as needed. For shortness of breath.     . beclomethasone (QVAR) 80 MCG/ACT inhaler Inhale 1 puff into the lungs 2 (two) times daily.      . benztropine (COGENTIN) 1 MG tablet Take 1 mg by mouth 2 (two) times daily.      . clonazePAM  (KLONOPIN) 0.5 MG tablet Take 0.5 mg by mouth at bedtime.     . clopidogrel (PLAVIX) 75 MG tablet Take 1 tablet (75 mg total) by mouth daily. 30 tablet 0  . FLUoxetine (PROZAC) 20 MG capsule Take 20 mg by mouth daily.      . hydrOXYzine (ATARAX/VISTARIL) 50 MG tablet Take 50-100 mg by mouth 3 (three) times daily as needed. 50 mg (1 tab) in am AND 100 mg (2 tab) at bedtime    . memantine (NAMENDA) 5 MG tablet Take 5 mg by mouth 2 (two) times daily.      . pantoprazole (PROTONIX) 40 MG tablet Take 1 tablet (40 mg total) by mouth daily. 30 tablet 8  . risperiDONE (RISPERDAL) 1 MG tablet Take 1-3 mg by mouth 2 (two) times daily. Take 1 tablets every morning and 3 tablets every evening    . simvastatin (ZOCOR) 20 MG tablet Take 20 mg by mouth at bedtime.     Marland Kitchen. tiotropium (SPIRIVA) 18 MCG inhalation capsule Place 18 mcg into inhaler and inhale daily.       No current facility-administered medications for this visit.    Allergies  Allergen Reactions  . Ibuprofen Other (See Comments)    unknown  . Penicillins Other (See Comments)    unknown    History   Social History  . Marital Status: Divorced    Spouse Name: N/A    Number of Children: N/A  . Years of Education: N/A   Occupational History  . Not on file.   Social History Main Topics  . Smoking status: Current Every  Day Smoker  . Smokeless tobacco: Not on file  . Alcohol Use: No  . Drug Use: No  . Sexual Activity: Not on file   Other Topics Concern  . Not on file   Social History Narrative     Review of Systems: General: negative for chills, fever, night sweats or weight changes.  Cardiovascular: negative for chest pain, dyspnea on exertion, edema, orthopnea, palpitations, paroxysmal nocturnal dyspnea or shortness of breath Dermatological: negative for rash Respiratory: negative for cough or wheezing Urologic: negative for hematuria Abdominal: negative for nausea, vomiting, diarrhea, bright red blood per rectum, melena, or  hematemesis Neurologic: negative for visual changes, syncope, or dizziness All other systems reviewed and are otherwise negative except as noted above.    Blood pressure 134/78, pulse 86, height  (1.727 m), weight 177 lb 3.2 oz (80.377 kg).  General appearance: alert and no distress Neck: no adenopathy, no carotid bruit, no JVD, supple, symmetrical, trachea midline and thyroid not enlarged, symmetric, no tenderness/mass/nodules Lungs: clear to auscultation bilaterally Heart: regular rate and rhythm, S1, S2 normal, no murmur, click, rub or gallop Extremities: extremities normal, atraumatic, no cyanosis or edema and 2+ on the right, 1+ left common femoral  EKG normal sinus rhythm at 86 without ST or T-wave changes. At present reviewed this EKG  ASSESSMENT AND PLAN:   Dyslipidemia History of hyperlipidemia on simvastatin 20 mg a day. We will recheck a lipid and liver profile  Tobacco abuse History of tobacco abuse currently smoking one pack per day. He is recalcitrant to risk factor modification  PVD (peripheral vascular disease) s/p bilat iliac PTA this adm for ISR from 2007 Status post bilateral iliac stenting by myself 11/26/06 with documentation of "in-stent restenosis and repeat intervention 11/21/11 with bilateral ICast covered stents.he currently denies claudication. His most recent lower extremity arterial Doppler studies performed 08/02/13 revealed normal ABIs with a high-frequency signal in his left common iliac artery. We will repeat lower extremity arterial Doppler studies      Runell Gess MD [facility], [facility] 11/21/2014 11:12 AM

## 2014-11-21 NOTE — Assessment & Plan Note (Signed)
History of tobacco abuse currently smoking one pack per day. He is recalcitrant to risk factor modification

## 2014-11-21 NOTE — Assessment & Plan Note (Signed)
History of hyperlipidemia on simvastatin 20 mg a day. We will recheck a lipid and liver profile 

## 2014-11-30 ENCOUNTER — Encounter: Payer: Self-pay | Admitting: *Deleted

## 2014-11-30 LAB — HEPATIC FUNCTION PANEL
ALK PHOS: 107 U/L (ref 39–117)
ALT: 20 U/L (ref 0–53)
AST: 18 U/L (ref 0–37)
Albumin: 4.2 g/dL (ref 3.5–5.2)
Bilirubin, Direct: 0.1 mg/dL (ref 0.0–0.3)
Indirect Bilirubin: 0.4 mg/dL (ref 0.2–1.2)
TOTAL PROTEIN: 7.1 g/dL (ref 6.0–8.3)
Total Bilirubin: 0.5 mg/dL (ref 0.2–1.2)

## 2014-11-30 LAB — LIPID PANEL
Cholesterol: 123 mg/dL (ref 0–200)
HDL: 29 mg/dL — AB (ref >39)
LDL CALC: 55 mg/dL (ref 0–99)
Total CHOL/HDL Ratio: 4.2 Ratio
Triglycerides: 194 mg/dL — ABNORMAL HIGH (ref <150)
VLDL: 39 mg/dL (ref 0–40)

## 2014-12-11 ENCOUNTER — Other Ambulatory Visit: Payer: Self-pay | Admitting: Cardiovascular Disease

## 2014-12-11 NOTE — Telephone Encounter (Signed)
Rx(s) sent to pharmacy electronically.  

## 2014-12-12 ENCOUNTER — Inpatient Hospital Stay (HOSPITAL_COMMUNITY): Admission: RE | Admit: 2014-12-12 | Payer: Commercial Managed Care - HMO | Source: Ambulatory Visit

## 2014-12-21 ENCOUNTER — Inpatient Hospital Stay (HOSPITAL_COMMUNITY): Admission: RE | Admit: 2014-12-21 | Payer: Commercial Managed Care - HMO | Source: Ambulatory Visit

## 2015-01-04 ENCOUNTER — Ambulatory Visit (HOSPITAL_COMMUNITY)
Admission: RE | Admit: 2015-01-04 | Discharge: 2015-01-04 | Disposition: A | Discharge status: Home / Self Care | Payer: Medicare HMO | Source: Ambulatory Visit | Attending: Cardiovascular Disease | Admitting: Cardiovascular Disease

## 2015-01-04 DIAGNOSIS — I739 Peripheral vascular disease, unspecified: Secondary | ICD-10-CM | POA: Diagnosis not present

## 2015-01-04 NOTE — Progress Notes (Signed)
Lower Extremity Arterial Duplex Completed. °Brianna L Mazza,RVT °

## 2015-01-23 ENCOUNTER — Encounter: Payer: Self-pay | Admitting: *Deleted

## 2015-01-25 ENCOUNTER — Encounter: Payer: Self-pay | Admitting: *Deleted

## 2015-11-06 ENCOUNTER — Other Ambulatory Visit: Payer: Self-pay | Admitting: Cardiovascular Disease

## 2015-11-06 NOTE — Telephone Encounter (Signed)
Rx(s) sent to pharmacy electronically.  

## 2015-12-18 ENCOUNTER — Ambulatory Visit (INDEPENDENT_AMBULATORY_CARE_PROVIDER_SITE_OTHER): Payer: Medicare HMO | Admitting: Cardiovascular Disease

## 2015-12-18 ENCOUNTER — Encounter: Payer: Self-pay | Admitting: Cardiovascular Disease

## 2015-12-18 VITALS — BP 122/84 | HR 86 | Ht 68.0 in | Wt 178.0 lb

## 2015-12-18 DIAGNOSIS — E785 Hyperlipidemia, unspecified: Secondary | ICD-10-CM

## 2015-12-18 DIAGNOSIS — I739 Peripheral vascular disease, unspecified: Secondary | ICD-10-CM

## 2015-12-18 DIAGNOSIS — Z720 Tobacco use: Secondary | ICD-10-CM

## 2015-12-18 NOTE — Assessment & Plan Note (Signed)
History of dyslipidemia on simvastatin followed by his PCP. We will check a lipid and liver profile

## 2015-12-18 NOTE — Assessment & Plan Note (Signed)
History of peripheral arterial disease status post bilateral iliac artery PTA and stenting by myself 11/26/06. Because of recurrent symptoms and abnormal Dopplers I restudied him 11/21/11 revealing an occluded right common iliac artery stent with high-grade in stent restenosis within the left common iliac artery stent. I restented both iliacs using ICast covered stents with excellent clinical and Doppler result. He currently denies claudication. His last Doppler study performed 01/04/15 revealed a right ABI of 1 and a left appointment 911 with patent iliac stents. He did have a fairly high frequency signal in his left common iliac artery. We will obtain lower extremity arterial Doppler studies

## 2015-12-18 NOTE — Assessment & Plan Note (Signed)
History of continued tobacco abuse currently smoking one pack per day recalcitrant to risk factor modification

## 2015-12-18 NOTE — Patient Instructions (Addendum)
Medication Instructions:  Your physician recommends that you continue on your current medications as directed. Please refer to the Current Medication list given to you today.   Labwork: Your physician recommends that you return for lab work in: FASTING (lipid/liver) The lab can be found on the FIRST FLOOR of out building in [address]   Testing/Procedures: Your physician has requested that you have a lower extremity arterial doppler- During this test, ultrasound is used to evaluate arterial blood flow in the legs. Allow approximately one hour for this exam.    Follow-Up: Your physician wants you to follow-up in: 12 months with Dr. Berry. You will receive a reminder letter in the mail two months in advance. If you don't receive a letter, please call our office to schedule the follow-up appointment.   Any Other Special Instructions Will Be Listed Below (If Applicable).     If you need a refill on your cardiac medications before your next appointment, please call your pharmacy.   

## 2015-12-18 NOTE — Progress Notes (Signed)
12/18/2015 [name] Vega   08-Nov-1961  [id number]  Primary Physician [name] Conrad, MD Primary Cardiologist: Runell Gess MD Roseanne Reno   HPI:  [name] Vega is a 55 year old thin-appearing divorced Caucasian male father one child who lives with his parents and is involved with an act team he has schizoaffective disorder on multiple psychotropic drugs and sees a psychiatrist. He has a history of peripheral vascular occlusive disease status post bilateral iliac artery PTA and stenting by myself 11/26/2006. His other problems include hyperlipidemia and continued tobacco abuse.I last saw him in the office after his procedure in 2013.  He had a Myoview stress test performed in our office 11/12/2006 which was low risk. Dopplers perform arks 16 2012 revealed an ABI of 0.93 on the right and one on the left with high-frequency signals in both iliac arteries. When I saw him back in June I intended to get followup Dopplers which were not obtained. Recent Dopplers however performed 09/26/2011 revealed that the right iliac stent has subsequently occluded and his left iliac stent had gotten progressively worse. He had disabling claudication. I re-angiogramed him 11/21/11 revealing an occluded right common iliac stent with high grade instant restenosis within the left common iliac stent. Both were percutaneously recanalized with ICast covered stents resulting in marked clinical angiographic and Doppler improvement. He currently denies claudication, chest pain or shortness of breath. Since I saw him one year ago he denies chest pain, shortness of breath or claudication. He does continue to smoke one pack per day.   Current Outpatient Prescriptions  Medication Sig Dispense Refill  . albuterol (PROVENTIL) (2.5 MG/3ML) 0.083% nebulizer solution Take 2.5 mg by nebulization every 6 (six) hours as needed. For shortness of breath.     . benztropine (COGENTIN) 1 MG tablet Take 1 mg by mouth 2 (two)  times daily.      . clonazePAM (KLONOPIN) 0.5 MG tablet Take 0.5 mg by mouth at bedtime.     . clopidogrel (PLAVIX) 75 MG tablet TAKE 1 TABLET (75 MG TOTAL) BY MOUTH DAILY. 30 tablet 10  . FLUoxetine (PROZAC) 20 MG capsule Take 20 mg by mouth daily.      . memantine (NAMENDA) 5 MG tablet Take 5 mg by mouth 2 (two) times daily.      . pantoprazole (PROTONIX) 40 MG tablet Take 1 tablet (40 mg total) by mouth daily. 30 tablet 8  . risperiDONE (RISPERDAL) 1 MG tablet Take 3 mg by mouth 2 (two) times daily. Take 1 tablets every morning and 3 tablets every evening    . simvastatin (ZOCOR) 20 MG tablet Take 20 mg by mouth at bedtime.     Marland Kitchen tiotropium (SPIRIVA) 18 MCG inhalation capsule Place 18 mcg into inhaler and inhale daily.       No current facility-administered medications for this visit.    Allergies  Allergen Reactions  . Ibuprofen Other (See Comments)    unknown  . Penicillins Other (See Comments)    unknown    Social History   Social History  . Marital Status: Divorced    Spouse Name: N/A  . Number of Children: N/A  . Years of Education: N/A   Occupational History  . Not on file.   Social History Main Topics  . Smoking status: Current Every Day Smoker  . Smokeless tobacco: Not on file  . Alcohol Use: No  . Drug Use: No  . Sexual Activity: Not on file   Other Topics Concern  .  Not on file   Social History Narrative     Review of Systems: General: negative for chills, fever, night sweats or weight changes.  Cardiovascular: negative for chest pain, dyspnea on exertion, edema, orthopnea, palpitations, paroxysmal nocturnal dyspnea or shortness of breath Dermatological: negative for rash Respiratory: negative for cough or wheezing Urologic: negative for hematuria Abdominal: negative for nausea, vomiting, diarrhea, bright red blood per rectum, melena, or hematemesis Neurologic: negative for visual changes, syncope, or dizziness All other systems reviewed and are  otherwise negative except as noted above.    Blood pressure 122/84, pulse 86, height  (1.727 m), weight 178 lb (80.74 kg).  General appearance: cooperative and no distress Neck: no adenopathy, no carotid bruit, no JVD, supple, symmetrical, trachea midline and thyroid not enlarged, symmetric, no tenderness/mass/nodules Lungs: clear to auscultation bilaterally Heart: regular rate and rhythm, S1, S2 normal, no murmur, click, rub or gallop Extremities: extremities normal, atraumatic, no cyanosis or edema  EKG normal sinus rhythm at 86 without ST or T-wave changes. I personally reviewed this EKG  ASSESSMENT AND PLAN:   PVD (peripheral vascular disease) s/p bilat iliac PTA this adm for ISR from 2007 History of peripheral arterial disease status post bilateral iliac artery PTA and stenting by myself 11/26/06. Because of recurrent symptoms and abnormal Dopplers I restudied him 11/21/11 revealing an occluded right common iliac artery stent with high-grade in stent restenosis within the left common iliac artery stent. I restented both iliacs using ICast covered stents with excellent clinical and Doppler result. He currently denies claudication. His last Doppler study performed 01/04/15 revealed a right ABI of 1 and a left appointment 911 with patent iliac stents. He did have a fairly high frequency signal in his left common iliac artery. We will obtain lower extremity arterial Doppler studies  Dyslipidemia History of dyslipidemia on simvastatin followed by his PCP. We will check a lipid and liver profile  Tobacco abuse History of continued tobacco abuse currently smoking one pack per day recalcitrant to risk factor modification      Runell Gess MD [facility], [facility] 12/18/2015 12:27 PM

## 2016-01-02 ENCOUNTER — Other Ambulatory Visit: Payer: Self-pay | Admitting: Cardiovascular Disease

## 2016-01-02 ENCOUNTER — Ambulatory Visit (HOSPITAL_COMMUNITY)
Admission: RE | Admit: 2016-01-02 | Discharge: 2016-01-02 | Disposition: A | Discharge status: Home / Self Care | Payer: Medicare HMO | Source: Ambulatory Visit | Attending: Cardiovascular Disease | Admitting: Cardiovascular Disease

## 2016-01-02 DIAGNOSIS — I708 Atherosclerosis of other arteries: Secondary | ICD-10-CM | POA: Insufficient documentation

## 2016-01-02 DIAGNOSIS — Z720 Tobacco use: Secondary | ICD-10-CM | POA: Insufficient documentation

## 2016-01-02 DIAGNOSIS — I779 Disorder of arteries and arterioles, unspecified: Secondary | ICD-10-CM | POA: Diagnosis not present

## 2016-01-02 DIAGNOSIS — Z9889 Other specified postprocedural states: Secondary | ICD-10-CM | POA: Insufficient documentation

## 2016-01-02 DIAGNOSIS — I739 Peripheral vascular disease, unspecified: Secondary | ICD-10-CM

## 2016-01-02 DIAGNOSIS — E785 Hyperlipidemia, unspecified: Secondary | ICD-10-CM | POA: Insufficient documentation

## 2016-01-04 ENCOUNTER — Telehealth: Payer: Self-pay

## 2016-01-04 DIAGNOSIS — I739 Peripheral vascular disease, unspecified: Secondary | ICD-10-CM

## 2016-01-04 NOTE — Telephone Encounter (Signed)
-----   Message from Runell Gess, MD sent at 01/02/2016  8:20 PM EST ----- No change from prior study. Repeat in 12 months.

## 2016-10-29 ENCOUNTER — Other Ambulatory Visit: Payer: Self-pay | Admitting: Cardiovascular Disease

## 2016-10-29 NOTE — Telephone Encounter (Signed)
Rx(s) sent to pharmacy electronically.  

## 2016-11-07 ENCOUNTER — Encounter (HOSPITAL_COMMUNITY): Payer: Self-pay | Admitting: Cardiovascular Disease

## 2017-01-09 ENCOUNTER — Other Ambulatory Visit: Payer: Self-pay | Admitting: Cardiovascular Disease

## 2017-01-09 DIAGNOSIS — Z95828 Presence of other vascular implants and grafts: Secondary | ICD-10-CM

## 2017-01-12 ENCOUNTER — Ambulatory Visit (HOSPITAL_COMMUNITY)
Admission: RE | Admit: 2017-01-12 | Discharge: 2017-01-12 | Disposition: A | Discharge status: Home / Self Care | Payer: Medicare HMO | Source: Ambulatory Visit | Attending: Cardiology | Admitting: Cardiology

## 2017-01-12 DIAGNOSIS — Z720 Tobacco use: Secondary | ICD-10-CM | POA: Insufficient documentation

## 2017-01-12 DIAGNOSIS — E785 Hyperlipidemia, unspecified: Secondary | ICD-10-CM | POA: Insufficient documentation

## 2017-01-12 DIAGNOSIS — Z95828 Presence of other vascular implants and grafts: Secondary | ICD-10-CM | POA: Insufficient documentation

## 2017-01-12 DIAGNOSIS — Z5189 Encounter for other specified aftercare: Secondary | ICD-10-CM | POA: Insufficient documentation

## 2017-01-12 DIAGNOSIS — Z9889 Other specified postprocedural states: Secondary | ICD-10-CM | POA: Insufficient documentation

## 2017-01-12 DIAGNOSIS — I708 Atherosclerosis of other arteries: Secondary | ICD-10-CM | POA: Insufficient documentation

## 2017-01-12 DIAGNOSIS — I739 Peripheral vascular disease, unspecified: Secondary | ICD-10-CM

## 2017-01-16 ENCOUNTER — Other Ambulatory Visit: Payer: Self-pay | Admitting: Cardiovascular Disease

## 2017-01-16 NOTE — Telephone Encounter (Signed)
Rx(s) sent to pharmacy electronically.  

## 2017-01-21 ENCOUNTER — Other Ambulatory Visit: Payer: Self-pay | Admitting: Cardiovascular Disease

## 2017-01-21 DIAGNOSIS — I739 Peripheral vascular disease, unspecified: Secondary | ICD-10-CM

## 2017-02-21 ENCOUNTER — Other Ambulatory Visit: Payer: Self-pay | Admitting: Cardiovascular Disease

## 2017-03-31 ENCOUNTER — Other Ambulatory Visit: Payer: Self-pay | Admitting: Cardiovascular Disease

## 2017-04-03 ENCOUNTER — Encounter: Payer: Self-pay | Admitting: *Deleted

## 2017-04-03 ENCOUNTER — Ambulatory Visit: Payer: Medicare HMO | Admitting: Cardiovascular Disease

## 2017-04-27 ENCOUNTER — Other Ambulatory Visit: Payer: Self-pay | Admitting: Cardiovascular Disease

## 2017-09-09 ENCOUNTER — Other Ambulatory Visit: Payer: Self-pay | Admitting: *Deleted

## 2017-09-09 DIAGNOSIS — I739 Peripheral vascular disease, unspecified: Secondary | ICD-10-CM

## 2018-03-15 ENCOUNTER — Other Ambulatory Visit: Payer: Self-pay | Admitting: Cardiovascular Disease

## 2018-03-20 ENCOUNTER — Other Ambulatory Visit: Payer: Self-pay | Admitting: Cardiovascular Disease

## 2018-03-22 NOTE — Telephone Encounter (Signed)
REFILL 

## 2018-04-06 ENCOUNTER — Other Ambulatory Visit: Payer: Self-pay | Admitting: *Deleted

## 2018-04-06 MED ORDER — CLOPIDOGREL BISULFATE 75 MG PO TABS
75.0000 mg | ORAL_TABLET | Freq: Every day | ORAL | 0 refills | Status: AC
Start: 2018-04-06 — End: ?
